# Patient Record
Sex: Male | Born: 1955 | Race: White | Hispanic: No | Marital: Married | Smoking: Never smoker
Health system: Southern US, Community
[De-identification: ages and names within clinical notes are randomized; demographics above are authoritative.]

## PROBLEM LIST (undated history)

## (undated) DIAGNOSIS — H269 Unspecified cataract: Secondary | ICD-10-CM

## (undated) DIAGNOSIS — H409 Unspecified glaucoma: Secondary | ICD-10-CM

## (undated) HISTORY — DX: Unspecified glaucoma: H40.9

## (undated) HISTORY — DX: Unspecified cataract: H26.9

---
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
## 2016-04-04 DIAGNOSIS — Z0100 Encounter for examination of eyes and vision without abnormal findings: Secondary | ICD-10-CM | POA: Diagnosis not present

## 2016-04-20 DIAGNOSIS — J1089 Influenza due to other identified influenza virus with other manifestations: Secondary | ICD-10-CM | POA: Diagnosis not present

## 2016-05-07 DIAGNOSIS — H21233 Degeneration of iris (pigmentary), bilateral: Secondary | ICD-10-CM | POA: Diagnosis not present

## 2016-05-07 DIAGNOSIS — H40023 Open angle with borderline findings, high risk, bilateral: Secondary | ICD-10-CM | POA: Diagnosis not present

## 2016-05-07 DIAGNOSIS — H2513 Age-related nuclear cataract, bilateral: Secondary | ICD-10-CM | POA: Diagnosis not present

## 2016-05-07 DIAGNOSIS — H25013 Cortical age-related cataract, bilateral: Secondary | ICD-10-CM | POA: Diagnosis not present

## 2016-05-07 DIAGNOSIS — H2511 Age-related nuclear cataract, right eye: Secondary | ICD-10-CM | POA: Diagnosis not present

## 2016-05-07 DIAGNOSIS — H40022 Open angle with borderline findings, high risk, left eye: Secondary | ICD-10-CM | POA: Diagnosis not present

## 2016-05-07 DIAGNOSIS — H524 Presbyopia: Secondary | ICD-10-CM | POA: Diagnosis not present

## 2016-05-07 DIAGNOSIS — H40021 Open angle with borderline findings, high risk, right eye: Secondary | ICD-10-CM | POA: Diagnosis not present

## 2016-05-08 ENCOUNTER — Emergency Department: Payer: BLUE CROSS/BLUE SHIELD

## 2016-05-08 ENCOUNTER — Encounter: Payer: Self-pay | Admitting: Primary Care

## 2016-05-08 ENCOUNTER — Emergency Department
Admission: EM | Admit: 2016-05-08 | Discharge: 2016-05-08 | Disposition: A | Discharge status: Home / Self Care | Payer: BLUE CROSS/BLUE SHIELD | Attending: Emergency Medicine | Admitting: Emergency Medicine

## 2016-05-08 ENCOUNTER — Encounter: Payer: Self-pay | Admitting: Emergency Medicine

## 2016-05-08 ENCOUNTER — Ambulatory Visit (INDEPENDENT_AMBULATORY_CARE_PROVIDER_SITE_OTHER): Payer: BLUE CROSS/BLUE SHIELD | Admitting: Primary Care

## 2016-05-08 VITALS — BP 112/78 | HR 77 | Temp 98.1°F | Ht 70.0 in | Wt 183.1 lb

## 2016-05-08 DIAGNOSIS — J111 Influenza due to unidentified influenza virus with other respiratory manifestations: Secondary | ICD-10-CM

## 2016-05-08 DIAGNOSIS — Z0000 Encounter for general adult medical examination without abnormal findings: Secondary | ICD-10-CM | POA: Diagnosis not present

## 2016-05-08 DIAGNOSIS — Z23 Encounter for immunization: Secondary | ICD-10-CM

## 2016-05-08 DIAGNOSIS — Z1159 Encounter for screening for other viral diseases: Secondary | ICD-10-CM

## 2016-05-08 DIAGNOSIS — R05 Cough: Secondary | ICD-10-CM | POA: Diagnosis not present

## 2016-05-08 DIAGNOSIS — R0602 Shortness of breath: Secondary | ICD-10-CM | POA: Insufficient documentation

## 2016-05-08 DIAGNOSIS — R69 Illness, unspecified: Secondary | ICD-10-CM

## 2016-05-08 DIAGNOSIS — Z87891 Personal history of nicotine dependence: Secondary | ICD-10-CM | POA: Diagnosis not present

## 2016-05-08 DIAGNOSIS — R509 Fever, unspecified: Secondary | ICD-10-CM | POA: Insufficient documentation

## 2016-05-08 DIAGNOSIS — R079 Chest pain, unspecified: Secondary | ICD-10-CM | POA: Diagnosis not present

## 2016-05-08 DIAGNOSIS — Z1211 Encounter for screening for malignant neoplasm of colon: Secondary | ICD-10-CM

## 2016-05-08 DIAGNOSIS — H409 Unspecified glaucoma: Secondary | ICD-10-CM | POA: Insufficient documentation

## 2016-05-08 LAB — BASIC METABOLIC PANEL
ANION GAP: 10 (ref 5–15)
BUN: 19 mg/dL (ref 6–20)
CALCIUM: 8.3 mg/dL — AB (ref 8.9–10.3)
CO2: 21 mmol/L — ABNORMAL LOW (ref 22–32)
Chloride: 104 mmol/L (ref 101–111)
Creatinine, Ser: 1.05 mg/dL (ref 0.61–1.24)
Glucose, Bld: 105 mg/dL — ABNORMAL HIGH (ref 65–99)
Potassium: 3.3 mmol/L — ABNORMAL LOW (ref 3.5–5.1)
SODIUM: 135 mmol/L (ref 135–145)

## 2016-05-08 LAB — CBC
HCT: 37.4 % — ABNORMAL LOW (ref 40.0–52.0)
HEMOGLOBIN: 13 g/dL (ref 13.0–18.0)
MCH: 29.5 pg (ref 26.0–34.0)
MCHC: 34.8 g/dL (ref 32.0–36.0)
MCV: 84.7 fL (ref 80.0–100.0)
PLATELETS: 234 10*3/uL (ref 150–440)
RBC: 4.42 MIL/uL (ref 4.40–5.90)
RDW: 13 % (ref 11.5–14.5)
WBC: 10.9 10*3/uL — AB (ref 3.8–10.6)

## 2016-05-08 LAB — INFLUENZA PANEL BY PCR (TYPE A & B)
INFLAPCR: NEGATIVE
Influenza B By PCR: NEGATIVE

## 2016-05-08 LAB — TROPONIN I

## 2016-05-08 MED ORDER — IBUPROFEN 600 MG PO TABS
600.0000 mg | ORAL_TABLET | Freq: Once | ORAL | Status: AC
  Administered 2016-05-08: 600 mg via ORAL
  Filled 2016-05-08: qty 1

## 2016-05-08 MED ORDER — OSELTAMIVIR PHOSPHATE 75 MG PO CAPS: 75.0000 mg | ORAL_CAPSULE | Freq: Two times a day (BID) | ORAL | 0 refills | Status: AC

## 2016-05-08 MED ORDER — ACETAMINOPHEN 325 MG PO TABS
650.0000 mg | ORAL_TABLET | Freq: Once | ORAL | Status: AC | PRN
  Administered 2016-05-08: 650 mg via ORAL
  Filled 2016-05-08: qty 2

## 2016-05-08 NOTE — ED Triage Notes (Addendum)
Pt presents to ED with c/o fever, chills and generalized body aches starting this afternoon. Pt reports had physical today and was given a tetanus shot. Pt reports tested positive for flu on 1/5. Pt reports "I was breathing really heavy and that caused my chest to hurt a little." Feeling short of breath this evening. Mask applied to patient.

## 2016-05-08 NOTE — Assessment & Plan Note (Signed)
Td due, provided today. Declines influenza vaccination and shingles vaccination. Colonoscopy due, ordered today. PSA due, pending. Discussed to increase water, vegetable, fruit, whole grain consumption. Exam unremarkable. Labs pending. Follow up in 1 year for annual physical.

## 2016-05-08 NOTE — ED Provider Notes (Signed)
[facility] Emergency Department Provider Note  ____________________________________________   None    (approximate)  I have reviewed the triage vital signs and the nursing notes.   HISTORY  Chief Complaint Fever and Shortness of Breath   HPI Thomas Curry is a 61 y.o. male with a history of glaucoma was presenting to the emergency department with several hours of bodyaches as well as chest heaviness over his sternum. He says that his symptoms started about a half an hour after receiving a tetanus shot. He has taken Tylenol as Afrin. He denies any pain to his throat or runny nose. He says that he had flu that was diagnosed on January 5 and completed a course of Tamiflu. He says that the symptoms feel identical to when he had the flu several weeks ago. Has the nausea vomiting or diarrhea.Also reports myalgias.   Past Medical History:  Diagnosis Date  . Cataracts, bilateral   . Glaucoma     Patient Active Problem List   Diagnosis Date Noted  . Glaucoma 05/08/2016  . Preventative health care 05/08/2016    Past Surgical History:  Procedure Laterality Date  . APPENDECTOMY  1961    Prior to Admission medications   Medication Sig Start Date End Date Taking? Authorizing Provider  ALPHAGAN P 0.1 % SOLN Place 1 drop into both eyes every 8 (eight) hours.  05/07/16   Historical Provider, MD    Allergies Patient has no known allergies.  Family History  Problem Relation Age of Onset  . Heart disease Father     Social History Social History  Substance Use Topics  . Smoking status: Never Smoker  . Smokeless tobacco: Former NeurosurgeonUser    Types: Chew  . Alcohol use No    Review of Systems Constitutional: fever Eyes: No visual changes. ENT: No sore throat. Cardiovascular: Denies chest pain. Respiratory: Denies shortness of breath. Gastrointestinal: No abdominal pain.  No nausea, no vomiting.  No diarrhea.  No constipation. Genitourinary: Negative for  dysuria. Musculoskeletal: Negative for back pain. Skin: Negative for rash. Neurological: Negative for headaches, focal weakness or numbness.  10-point ROS otherwise negative.  ____________________________________________   PHYSICAL EXAM:  VITAL SIGNS: ED Triage Vitals  Enc Vitals Group     BP 05/08/16 2046 121/73     Pulse Rate 05/08/16 2046 (!) 106     Resp 05/08/16 2046 18     Temp 05/08/16 2046 (!) 101 F (38.3 C)     Temp Source 05/08/16 2046 Oral     SpO2 05/08/16 2046 99 %     Weight 05/08/16 2047 182 lb (82.6 kg)     Height 05/08/16 2047 5\' 10"  (1.778 m)     Head Circumference --      Peak Flow --      Pain Score 05/08/16 2051 2     Pain Loc --      Pain Edu? --      Excl. in GC? --     Constitutional: Alert and oriented. Well appearing and in no acute distress. Eyes: Conjunctivae are normal. PERRL. EOMI. Head: Atraumatic. Nose: No congestion/rhinnorhea. Mouth/Throat: Mucous membranes are moist.   Neck: No stridor.   Cardiovascular: Normal rate, regular rhythm. Grossly normal heart sounds.   Respiratory: Normal respiratory effort.  No retractions. Lungs CTAB. Gastrointestinal: Soft and nontender. No distention.  Musculoskeletal: No lower extremity tenderness nor edema.  No joint effusions. Neurologic:  Normal speech and language. No gross focal neurologic deficits are appreciated.  Skin:  Skin is warm, dry and intact. No rash noted. Psychiatric: Mood and affect are normal. Speech and behavior are normal.  Bedside ultrasound without any pericardial effusion. ____________________________________________   LABS (all labs ordered are listed, but only abnormal results are displayed)  Labs Reviewed  BASIC METABOLIC PANEL - Abnormal; Notable for the following:       Result Value   Potassium 3.3 (*)    CO2 21 (*)    Glucose, Bld 105 (*)    Calcium 8.3 (*)    All other components within normal limits  CBC - Abnormal; Notable for the following:    WBC 10.9  (*)    HCT 37.4 (*)    All other components within normal limits  TROPONIN I  INFLUENZA PANEL BY PCR (TYPE A & B)   ____________________________________________  EKG  ED ECG REPORT I, Arelia Longest, the attending physician, personally viewed and interpreted this ECG.   Date: 05/08/2016  EKG Time: 2059  Rate: 84  Rhythm: normal sinus rhythm  Axis: Normal  Intervals:none  ST&T Change: No ST segment elevation or depression. No abnormal T-wave inversion.  ____________________________________________  RADIOLOGY  DG Chest 2 View (Final result)  Result time 05/08/16 21:35:33  Final result by Kennith Center, MD (05/08/16 21:35:33)           Narrative:   CLINICAL DATA: Mid chest pain and shortness of breath with cough and fever.  EXAM: CHEST 2 VIEW  COMPARISON: None.  FINDINGS: The lungs are clear wiithout focal pneumonia, edema, pneumothorax or pleural effusion. Lungs are hyperexpanded. Interstitial markings are diffusely coarsened with chronic features. The cardiopericardial silhouette is within normal limits for size. The visualized bony structures of the thorax are intact.  IMPRESSION: No acute cardiopulmonary findings.   Electronically Signed By: Kennith Center M.D. On: 05/08/2016 21:35            ____________________________________________   PROCEDURES  Procedure(s) performed:   Procedures  Critical Care performed:   ____________________________________________   INITIAL IMPRESSION / ASSESSMENT AND PLAN / ED COURSE  Pertinent labs & imaging results that were available during my care of the patient were reviewed by me and considered in my medical decision making (see chart for details).  Possible flulike illness versus reaction to tetanus shot. Patient said that he took Tylenol just prior to arrival but still has a persistent fever. Will be given ibuprofen and reassessed. I also discussed giving a wait-and-see prescription for  Tamiflu. We discussed that the symptoms did not go away by tomorrow that the patient could begin taking the Tamiflu tomorrow afternoon. It is possible, and he is concerned, that his symptoms may be caused by receiving a tetanus shot. He says that his sister has had a similar reaction to the tetanus shot in the past. He is denying any shortness breath at the time of the exam.    ----------------------------------------- 11:09 PM on 05/08/2016 -----------------------------------------  Patient has defervesced. Says that he is feeling greatly improved and without any body aches or pain or shortness of breath at this time. Will be discharged home. He understands to use the wait-and-see prescription for Tamiflu.  ____________________________________________   FINAL CLINICAL IMPRESSION(S) / ED DIAGNOSES  Fever. Shortness of breath.    NEW MEDICATIONS STARTED DURING THIS VISIT:  New Prescriptions   No medications on file     Note:  This document was prepared using Dragon voice recognition software and may include unintentional dictation errors.    Myra Rude  Meric Joye, MD 05/08/16 [id number]

## 2016-05-08 NOTE — Addendum Note (Signed)
Addended by: Tawnya CrookSAMBATH, Kareena Arrambide on: 05/08/2016 03:21 PM   Modules accepted: Orders

## 2016-05-08 NOTE — Patient Instructions (Signed)
Schedule a lab only appointment to return for labs. Ensure you're fasting 4 hours prior to your appointment. You may have water and black coffee only.  You will be contacted regarding your referral to GI for the colonoscopy.  Please let us know if you have not heard back within one week.   You were provided with a tetanus vaccination which will cover you for 10 years.  It's important to improve your diet by reducing consumption of fast food, fried food, processed snack foods, sugary drinks. Increase consumption of fresh vegetables and fruits, whole grains, water.  Ensure you are drinking 64 ounces of water daily.  Start exercising. You should be getting 150 minutes of moderate intensity exercise weekly.  Follow up in 1 year for repeat physical or sooner if needed.  It was a pleasure to meet you today! Please don't hesitate to call me with any questions. Welcome to [facility]!

## 2016-05-08 NOTE — Progress Notes (Signed)
Subjective:    Patient ID: Thomas Curry, male    DOB: 03-20-1956, 61 y.o.   MRN: [id number]  HPI  Thomas Curry is a 61 year old male who presents today to establish care and discuss the problems mentioned below. Will obtain old records. His last physical was years ago.   who presents today for complete physical.  Immunizations: -Tetanus: Completed over 10 years ago. -Influenza: Declines -Shingles: Declines  Diet: He endorses a healthy diet Breakfast: Cereal, diner food Lunch: Restaurants Dinner: Pasta, meat, vegetable, starch Snacks: Occasionally Desserts: Occasionally Beverages: Soda, little water, some sweet tea  Exercise: He does not currently exercise. Eye exam: Completed recently Dental exam: Completes semi-annually Colonoscopy: Never completed, due. PSA: Not completed recently. Hep C Screen: Due.     Review of Systems  Constitutional: Negative for unexpected weight change.  HENT: Negative for rhinorrhea.   Respiratory: Negative for cough and shortness of breath.   Cardiovascular: Negative for chest pain.  Gastrointestinal: Negative for constipation and diarrhea.  Genitourinary: Negative for difficulty urinating.  Musculoskeletal: Negative for arthralgias and myalgias.  Skin: Negative for rash.  Allergic/Immunologic: Negative for environmental allergies.  Neurological: Negative for dizziness, numbness and headaches.  Psychiatric/Behavioral:       Denies concerns for anxiety or depression       Past Medical History:  Diagnosis Date  . Cataracts, bilateral   . Glaucoma      Social History   Social History  . Marital status: Married    Spouse name: N/A  . Number of children: N/A  . Years of education: N/A   Occupational History  . Not on file.   Social History Main Topics  . Smoking status: Never Smoker  . Smokeless tobacco: Never Used  . Alcohol use Not on file  . Drug use: Unknown  . Sexual activity: Not on file   Other Topics Concern  .  Not on file   Social History Narrative  . No narrative on file    Past Surgical History:  Procedure Laterality Date  . APPENDECTOMY  1961    Family History  Problem Relation Age of Onset  . Heart disease Father     No Known Allergies  No current outpatient prescriptions on file prior to visit.   No current facility-administered medications on file prior to visit.     BP 112/78   Pulse 77   Temp 98.1 F (36.7 C) (Oral)   Ht 5\' 10"  (1.778 m)   Wt 183 lb 1.9 oz (83.1 kg)   SpO2 98%   BMI 26.27 kg/m    Objective:   Physical Exam  Constitutional: He is oriented to person, place, and time. He appears well-nourished.  HENT:  Right Ear: Tympanic membrane and ear canal normal.  Left Ear: Tympanic membrane and ear canal normal.  Nose: Nose normal. Right sinus exhibits no maxillary sinus tenderness and no frontal sinus tenderness. Left sinus exhibits no maxillary sinus tenderness and no frontal sinus tenderness.  Mouth/Throat: Oropharynx is clear and moist.  Eyes: Conjunctivae and EOM are normal. Pupils are equal, round, and reactive to light.  Neck: Neck supple. Carotid bruit is not present. No thyromegaly present.  Cardiovascular: Normal rate, regular rhythm and normal heart sounds.   Pulmonary/Chest: Effort normal and breath sounds normal. He has no wheezes. He has no rales.  Abdominal: Soft. Bowel sounds are normal. There is no tenderness.  Musculoskeletal: Normal range of motion.  Neurological: He is alert and oriented to person, place,  and time. He has normal reflexes. No cranial nerve deficit.  Skin: Skin is warm and dry.  Psychiatric: He has a normal mood and affect.          Assessment & Plan:

## 2016-05-08 NOTE — Assessment & Plan Note (Signed)
Diagnosed recently, managed on Alphagan gtts, following with opthomology. Also with cataracts, due for surgery in February.

## 2016-05-08 NOTE — Progress Notes (Signed)
Pre visit review using our clinic review tool, if applicable. No additional management support is needed unless otherwise documented below in the visit note. 

## 2016-05-08 NOTE — ED Notes (Signed)
Resumed care from nellie rn.  Pt alert.  States feeling better   nsr on monitor.  Family with pt.

## 2016-05-14 ENCOUNTER — Other Ambulatory Visit: Payer: BLUE CROSS/BLUE SHIELD

## 2016-05-15 ENCOUNTER — Ambulatory Visit (INDEPENDENT_AMBULATORY_CARE_PROVIDER_SITE_OTHER): Payer: BLUE CROSS/BLUE SHIELD | Admitting: Primary Care

## 2016-05-15 ENCOUNTER — Encounter: Payer: Self-pay | Admitting: Primary Care

## 2016-05-15 ENCOUNTER — Ambulatory Visit: Payer: BLUE CROSS/BLUE SHIELD | Admitting: Primary Care

## 2016-05-15 VITALS — BP 130/82 | HR 84 | Temp 98.0°F | Ht 70.0 in | Wt 182.8 lb

## 2016-05-15 DIAGNOSIS — Z0000 Encounter for general adult medical examination without abnormal findings: Secondary | ICD-10-CM

## 2016-05-15 DIAGNOSIS — T50A95A Adverse effect of other bacterial vaccines, initial encounter: Secondary | ICD-10-CM

## 2016-05-15 DIAGNOSIS — Z1159 Encounter for screening for other viral diseases: Secondary | ICD-10-CM | POA: Diagnosis not present

## 2016-05-15 DIAGNOSIS — H40053 Ocular hypertension, bilateral: Secondary | ICD-10-CM | POA: Diagnosis not present

## 2016-05-15 DIAGNOSIS — H21233 Degeneration of iris (pigmentary), bilateral: Secondary | ICD-10-CM | POA: Diagnosis not present

## 2016-05-15 LAB — LIPID PANEL
CHOLESTEROL: 179 mg/dL (ref 0–200)
HDL: 30.7 mg/dL — ABNORMAL LOW (ref >39.00)
LDL CALC: 115 mg/dL — AB (ref 0–99)
NONHDL: 148.26
Total CHOL/HDL Ratio: 6
Triglycerides: 168 mg/dL — ABNORMAL HIGH (ref 0.0–149.0)
VLDL: 33.6 mg/dL (ref 0.0–40.0)

## 2016-05-15 LAB — COMPREHENSIVE METABOLIC PANEL
ALBUMIN: 3.9 g/dL (ref 3.5–5.2)
ALK PHOS: 106 U/L (ref 39–117)
ALT: 15 U/L (ref 0–53)
AST: 13 U/L (ref 0–37)
BUN: 14 mg/dL (ref 6–23)
CHLORIDE: 103 meq/L (ref 96–112)
CO2: 33 mEq/L — ABNORMAL HIGH (ref 19–32)
CREATININE: 0.86 mg/dL (ref 0.40–1.50)
Calcium: 8.8 mg/dL (ref 8.4–10.5)
GFR: 96.03 mL/min (ref >60.00)
GLUCOSE: 93 mg/dL (ref 70–99)
Potassium: 4.2 mEq/L (ref 3.5–5.1)
SODIUM: 139 meq/L (ref 135–145)
TOTAL PROTEIN: 7 g/dL (ref 6.0–8.3)
Total Bilirubin: 0.4 mg/dL (ref 0.2–1.2)

## 2016-05-15 LAB — PSA: PSA: 2.06 ng/mL (ref 0.10–4.00)

## 2016-05-15 NOTE — Progress Notes (Signed)
Subjective:    Patient ID: Thomas Curry, male    DOB: 31-Jul-1955, 61 y.o.   MRN: [id number]  HPI  Thomas Curry is a 61 year old male who presents today for Emergency Department follow up.  He presented to [facility] ED on 05/08/16 with a chief complaint of body aches and chest heaviness that began 30 minutes after receiving a tetanus vaccination in our office. He was diagnosed and treated for influenza on 04/20/16 and completed a Tamiflu course. He did not disclose this information during that visit.  During his stay he underwent lab work including BMP (stable), CBC (unremarkable), Troponin 1 (negative), and influenza testing (negative). His symptoms improved over the next several hours after treatment with ibuprofen. He did experience fevers of 102 during his stay. He was discharged home later that evening.  Since discharge from the emergency department he's feeling well. He denies fevers, cough, sore throat, body aches, pain to injection site. He has no complaints today.  Review of Systems  Constitutional: Negative for fatigue and fever.  HENT: Negative for congestion and sore throat.   Respiratory: Negative for cough, shortness of breath and wheezing.   Cardiovascular: Negative for chest pain.       Past Medical History:  Diagnosis Date  . Cataracts, bilateral   . Glaucoma      Social History   Social History  . Marital status: Married    Spouse name: N/A  . Number of children: N/A  . Years of education: N/A   Occupational History  . Not on file.   Social History Main Topics  . Smoking status: Never Smoker  . Smokeless tobacco: Former Neurosurgeon    Types: Chew  . Alcohol use No  . Drug use: Unknown  . Sexual activity: Not on file   Other Topics Concern  . Not on file   Social History Narrative   Married.   Works as a Academic librarian.   Highest level of education: high school.        Past Surgical History:  Procedure Laterality Date  . APPENDECTOMY  1961    Family  History  Problem Relation Age of Onset  . Heart disease Father     No Known Allergies  Current Outpatient Prescriptions on File Prior to Visit  Medication Sig Dispense Refill  . ALPHAGAN P 0.1 % SOLN Place 1 drop into both eyes every 8 (eight) hours.   3   No current facility-administered medications on file prior to visit.     BP 130/82   Pulse 84   Temp 98 F (36.7 C) (Oral)   Ht 5\' 10"  (1.778 m)   Wt 182 lb 12.8 oz (82.9 kg)   SpO2 97%   BMI 26.23 kg/m    Objective:   Physical Exam  Constitutional: He appears well-nourished.  HENT:  Right Ear: Tympanic membrane and ear canal normal.  Left Ear: Tympanic membrane and ear canal normal.  Nose: No mucosal edema. Right sinus exhibits no maxillary sinus tenderness and no frontal sinus tenderness. Left sinus exhibits no maxillary sinus tenderness and no frontal sinus tenderness.  Mouth/Throat: Oropharynx is clear and moist.  Eyes: Conjunctivae are normal.  Neck: Neck supple.  Cardiovascular: Normal rate and regular rhythm.   Pulmonary/Chest: Effort normal and breath sounds normal. He has no wheezes. He has no rales.  Skin: Skin is warm and dry.          Assessment & Plan:  Emergency Department Follow Up:  Presented  to [facility] ED several hours after receiving Tetanus shot in our clinic. Experienced chest tightness with fevers within 30 minutes after receiving. Diagnosed with influenza and treated on 04/20/16 which was not disclosed during his visit on 05/08/16. Overall doing well. No complaints today. Exam unremarkable. Repeat potassium labs today given hypokalemia in ED. Fluids, rest, return precautions provided.  All ED notes and labs reviewed.  Morrie Sheldonlark,Virtie Bungert Kendal, NP

## 2016-05-15 NOTE — Patient Instructions (Addendum)
Complete lab work prior to leaving today from your physical. I will notify you of your results once received.   Ensure you are staying hydrated with water.  Please call me if your symptoms return.  It was a pleasure to see you today!

## 2016-05-15 NOTE — Progress Notes (Signed)
Pre visit review using our clinic review tool, if applicable. No additional management support is needed unless otherwise documented below in the visit note. 

## 2016-05-16 LAB — HEPATITIS C ANTIBODY: HCV Ab: NEGATIVE

## 2016-05-21 ENCOUNTER — Encounter: Payer: Self-pay | Admitting: *Deleted

## 2016-05-29 DIAGNOSIS — H25011 Cortical age-related cataract, right eye: Secondary | ICD-10-CM | POA: Diagnosis not present

## 2016-05-29 DIAGNOSIS — H25811 Combined forms of age-related cataract, right eye: Secondary | ICD-10-CM | POA: Diagnosis not present

## 2016-05-29 DIAGNOSIS — H2511 Age-related nuclear cataract, right eye: Secondary | ICD-10-CM | POA: Diagnosis not present

## 2016-09-20 ENCOUNTER — Encounter: Payer: Self-pay | Admitting: Gastroenterology

## 2016-09-26 DIAGNOSIS — H21233 Degeneration of iris (pigmentary), bilateral: Secondary | ICD-10-CM | POA: Diagnosis not present

## 2016-09-26 DIAGNOSIS — Z961 Presence of intraocular lens: Secondary | ICD-10-CM | POA: Diagnosis not present

## 2016-09-26 DIAGNOSIS — H40013 Open angle with borderline findings, low risk, bilateral: Secondary | ICD-10-CM | POA: Diagnosis not present

## 2016-11-02 ENCOUNTER — Ambulatory Visit (AMBULATORY_SURGERY_CENTER): Payer: Self-pay

## 2016-11-02 VITALS — Ht 71.0 in | Wt 190.2 lb

## 2016-11-02 DIAGNOSIS — Z1211 Encounter for screening for malignant neoplasm of colon: Secondary | ICD-10-CM

## 2016-11-02 MED ORDER — SUPREP BOWEL PREP KIT 17.5-3.13-1.6 GM/177ML PO SOLN
1.0000 | Freq: Once | ORAL | 0 refills | Status: AC
Start: 2016-11-02 — End: 2016-11-02

## 2016-11-02 NOTE — Progress Notes (Signed)
No allergies to eggs or soy No diet meds No home oxygen No past problems with anesthesia  Declined emmi 

## 2016-11-05 ENCOUNTER — Encounter: Payer: Self-pay | Admitting: Gastroenterology

## 2016-11-16 ENCOUNTER — Encounter: Payer: Self-pay | Admitting: Gastroenterology

## 2016-11-16 ENCOUNTER — Ambulatory Visit (AMBULATORY_SURGERY_CENTER): Payer: BLUE CROSS/BLUE SHIELD | Admitting: Gastroenterology

## 2016-11-16 VITALS — BP 106/56 | HR 57 | Temp 98.0°F | Resp 19 | Ht 71.0 in | Wt 190.0 lb

## 2016-11-16 DIAGNOSIS — Z1212 Encounter for screening for malignant neoplasm of rectum: Secondary | ICD-10-CM | POA: Diagnosis not present

## 2016-11-16 DIAGNOSIS — Z1211 Encounter for screening for malignant neoplasm of colon: Secondary | ICD-10-CM | POA: Diagnosis present

## 2016-11-16 MED ORDER — SODIUM CHLORIDE 0.9 % IV SOLN: 500.0000 mL | INTRAVENOUS | Status: AC

## 2016-11-16 NOTE — Op Note (Signed)
[name] Patient Name: Thomas Curry Procedure Date: 11/16/2016 1:18 PM MRN: [id number] Endoscopist: Meryl Dare , MD Age: 61 Referring MD:  Date of Birth: [birth year] Gender: Male Account #: [id number] Procedure:                Colonoscopy Indications:              Screening for colorectal malignant neoplasm Medicines:                Monitored Anesthesia Care Procedure:                Pre-Anesthesia Assessment:                           - Prior to the procedure, a History and Physical                            was performed, and patient medications and                            allergies were reviewed. The patient's tolerance of                            previous anesthesia was also reviewed. The risks                            and benefits of the procedure and the sedation                            options and risks were discussed with the patient.                            All questions were answered, and informed consent                            was obtained. Prior Anticoagulants: The patient has                            taken no previous anticoagulant or antiplatelet                            agents. ASA Grade Assessment: II - A patient with                            mild systemic disease. After reviewing the risks                            and benefits, the patient was deemed in                            satisfactory condition to undergo the procedure.                           After obtaining informed consent, the colonoscope  was passed under direct vision. Throughout the                            procedure, the patient's blood pressure, pulse, and                            oxygen saturations were monitored continuously. The                            Colonoscope was introduced through the anus and                            advanced to the the cecum, identified by                            appendiceal orifice and  ileocecal valve. The                            ileocecal valve, appendiceal orifice, and rectum                            were photographed. The quality of the bowel                            preparation was excellent. The colonoscopy was                            performed without difficulty. The patient tolerated                            the procedure well. Scope In: 1:23:05 PM Scope Out: 1:34:41 PM Scope Withdrawal Time: 0 hours 9 minutes 29 seconds  Total Procedure Duration: 0 hours 11 minutes 36 seconds  Findings:                 The perianal and digital rectal examinations were                            normal.                           Internal hemorrhoids were found during                            retroflexion. The hemorrhoids were small and Grade                            I (internal hemorrhoids that do not prolapse).                           The exam was otherwise without abnormality on                            direct and retroflexion views. Complications:            No immediate complications. Estimated blood loss:  None. Estimated Blood Loss:     Estimated blood loss: none. Impression:               -                           - Internal hemorrhoids.                           - The entire examined colon was otherwise normal on                            direct and retroflexion views.                           - No specimens collected. Recommendation:           - Repeat colonoscopy in 10 years for screening                            purposes.                           - Patient has a contact number available for                            emergencies. The signs and symptoms of potential                            delayed complications were discussed with the                            patient. Return to normal activities tomorrow.                            Written discharge instructions were provided to the                             patient.                           - Resume previous diet.                           - Continue present medications. Meryl DareMalcolm T Azuri Bozard, MD 11/16/2016 1:38:00 PM This report has been signed electronically.

## 2016-11-16 NOTE — Progress Notes (Signed)
Pt's states no medical or surgical changes since previsit or office visit. 

## 2016-11-16 NOTE — Progress Notes (Signed)
  [facility] Anesthesia Post-op Note  Patient: Thomas Curry  Procedure(s) Performed: colonoscopy  Patient Location: [facility] - Recovery Area  Anesthesia Type: Deep Sedation/Propofol  Level of Consciousness: awake, oriented and patient cooperative  Airway and Oxygen Therapy: Patient Spontanous Breathing  Post-op Pain: none  Post-op Assessment:  Post-op Vital signs reviewed, Patient's Cardiovascular Status Stable, Respiratory Function Stable, Patent Airway, No signs of Nausea or vomiting and Pain level controlled  Post-op Vital Signs: Reviewed and stable  Complications: No apparent anesthesia complications  Alanea Woolridge E Safiyah Cisney 1:40 PM

## 2016-11-16 NOTE — Patient Instructions (Signed)
YOU HAD AN ENDOSCOPIC PROCEDURE TODAY AT THE [facility]:   Refer to the procedure report that was given to you for any specific questions about what was found during the examination.  If the procedure report does not answer your questions, please call your gastroenterologist to clarify.  If you requested that your care partner not be given the details of your procedure findings, then the procedure report has been included in a sealed envelope for you to review at your convenience later.  YOU SHOULD EXPECT: Some feelings of bloating in the abdomen. Passage of more gas than usual.  Walking can help get rid of the air that was put into your GI tract during the procedure and reduce the bloating. If you had a lower endoscopy (such as a colonoscopy or flexible sigmoidoscopy) you may notice spotting of blood in your stool or on the toilet paper. If you underwent a bowel prep for your procedure, you may not have a normal bowel movement for a few days.  Please Note:  You might notice some irritation and congestion in your nose or some drainage.  This is from the oxygen used during your procedure.  There is no need for concern and it should clear up in a day or so.  SYMPTOMS TO REPORT IMMEDIATELY:   Following lower endoscopy (colonoscopy or flexible sigmoidoscopy):  Excessive amounts of blood in the stool  Significant tenderness or worsening of abdominal pains  Swelling of the abdomen that is new, acute  Fever of 100F or higher  For urgent or emergent issues, a gastroenterologist can be reached at any hour by calling [phone].   DIET:  We do recommend a small meal at first, but then you may proceed to your regular diet.  Drink plenty of fluids but you should avoid alcoholic beverages for 24 hours.  ACTIVITY:  You should plan to take it easy for the rest of today and you should NOT DRIVE or use heavy machinery until tomorrow (because of the sedation medicines used during the test).     FOLLOW UP: Our staff will call the number listed on your records the next business day following your procedure to check on you and address any questions or concerns that you may have regarding the information given to you following your procedure. If we do not reach you, we will leave a message.  However, if you are feeling well and you are not experiencing any problems, there is no need to return our call.  We will assume that you have returned to your regular daily activities without incident.  If any biopsies were taken you will be contacted by phone or by letter within the next 1-3 weeks.  Please call us at [phone] if you have not heard about the biopsies in 3 weeks.    SIGNATURES/CONFIDENTIALITY: You and/or your care partner have signed paperwork which will be entered into your electronic medical record.  These signatures attest to the fact that that the information above on your After Visit Summary has been reviewed and is understood.  Full responsibility of the confidentiality of this discharge information lies with you and/or your care-partner. 

## 2016-11-19 ENCOUNTER — Telehealth: Payer: Self-pay

## 2016-11-19 NOTE — Telephone Encounter (Signed)
Called [phone]  we tried to reach pt for a follow up call.  The pt's phone is not set up to take voice mail.  No message left.Lenice Llamas. [email or web address]

## 2016-11-20 ENCOUNTER — Telehealth: Payer: Self-pay | Admitting: *Deleted

## 2016-11-20 NOTE — Telephone Encounter (Signed)
  Follow up Call-  Call back number 11/16/2016  Post procedure Call Back phone  # [phone]  Permission to leave phone message No  comments phone not set up for messages     Patient questions:  Do you have a fever, pain , or abdominal swelling? No. Pain Score  0 *  Have you tolerated food without any problems? Yes.    Have you been able to return to your normal activities? Yes.    Do you have any questions about your discharge instructions: Diet   No. Medications  No. Follow up visit  No.  Do you have questions or concerns about your Care? No.  Actions: * If pain score is 4 or above: No action needed, pain <4.

## 2017-04-11 DIAGNOSIS — H40013 Open angle with borderline findings, low risk, bilateral: Secondary | ICD-10-CM | POA: Diagnosis not present

## 2017-04-11 DIAGNOSIS — Z961 Presence of intraocular lens: Secondary | ICD-10-CM | POA: Diagnosis not present

## 2017-04-11 DIAGNOSIS — H21233 Degeneration of iris (pigmentary), bilateral: Secondary | ICD-10-CM | POA: Diagnosis not present

## 2017-09-28 IMAGING — CR DG CHEST 2V
1 series · 2 of 2 positions shown · non-contrast
Comparison: None.

CLINICAL DATA: Mid chest pain and shortness of breath with cough
and fever.

EXAM:
CHEST  2 VIEW

[Series 1: dg chest 2 view · 0.14mm/px · 2 of 2 slices shown]
[im 1/2]
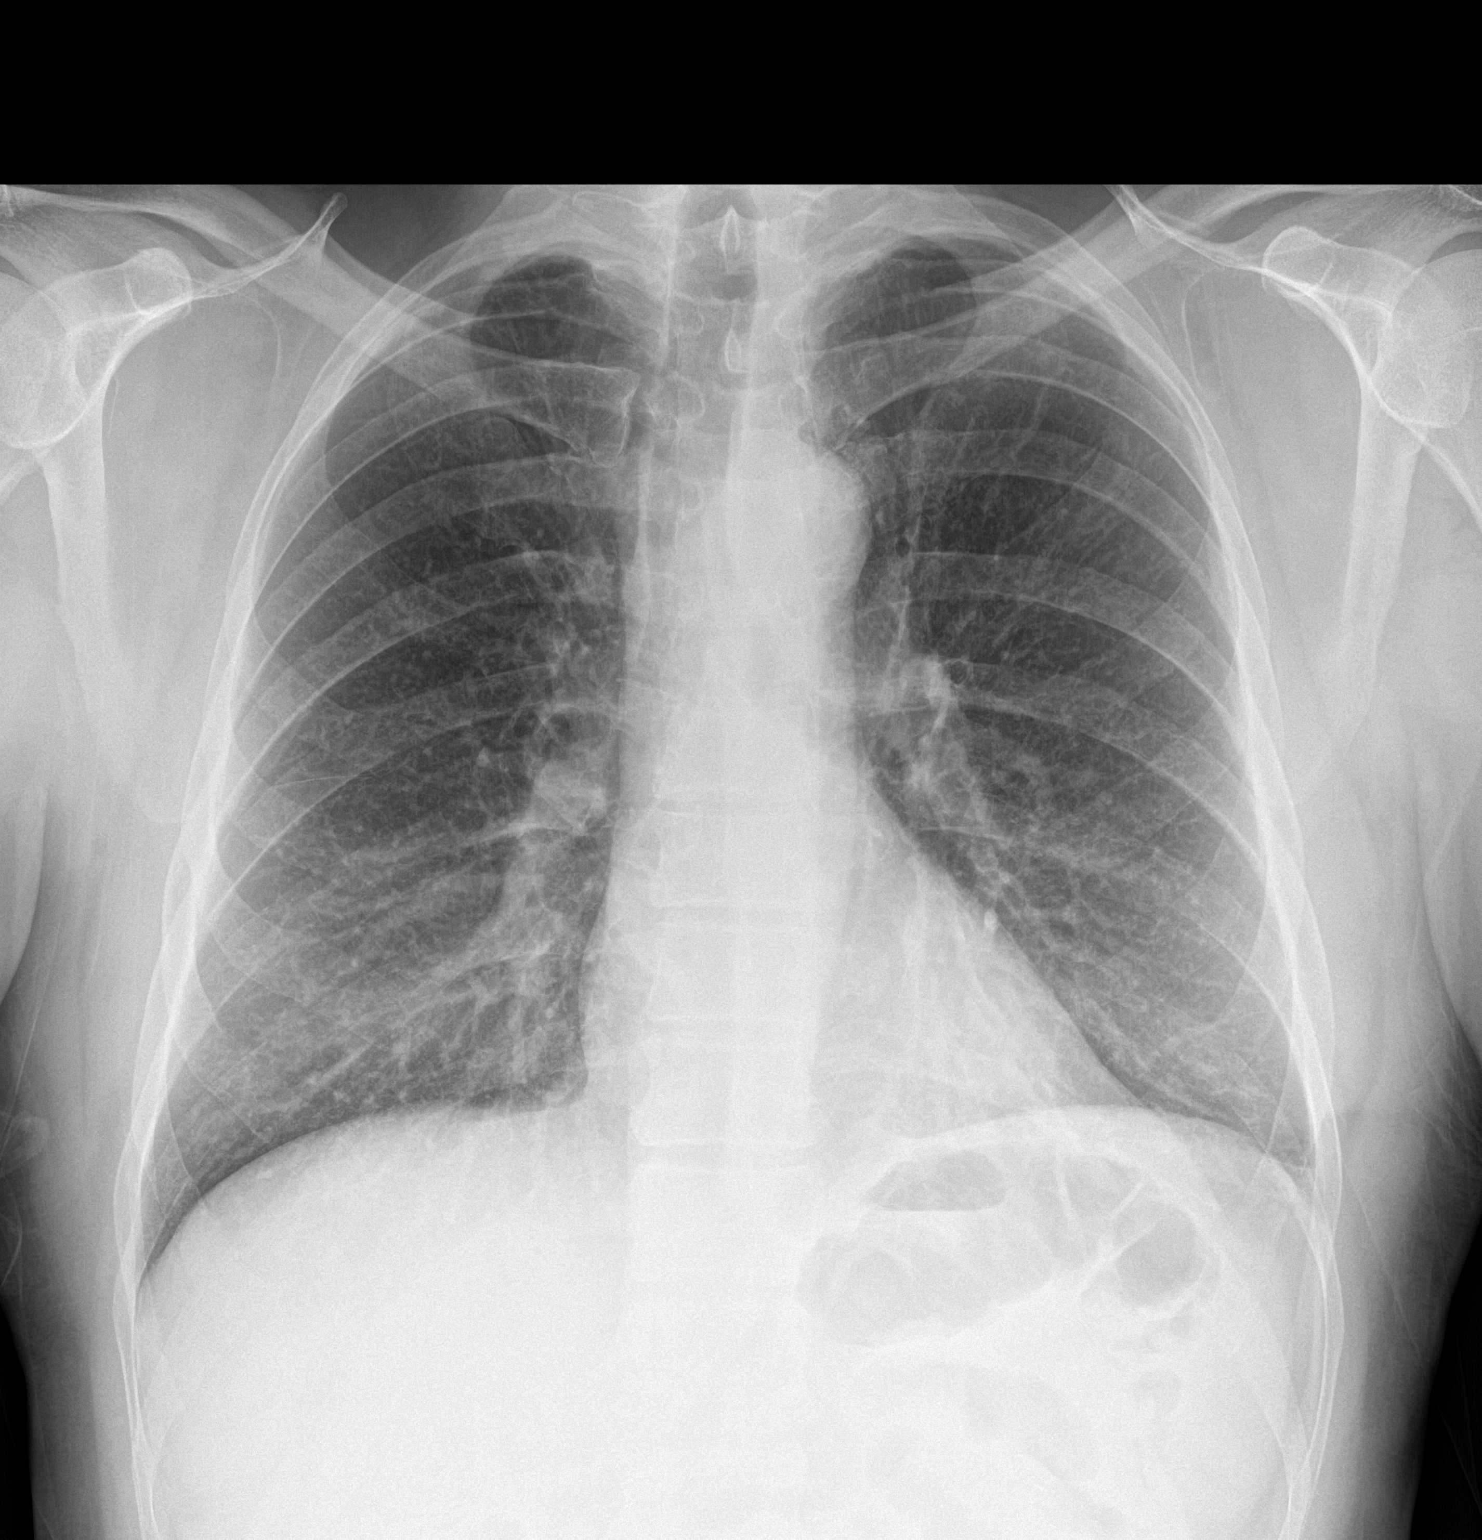
[im 2/2]
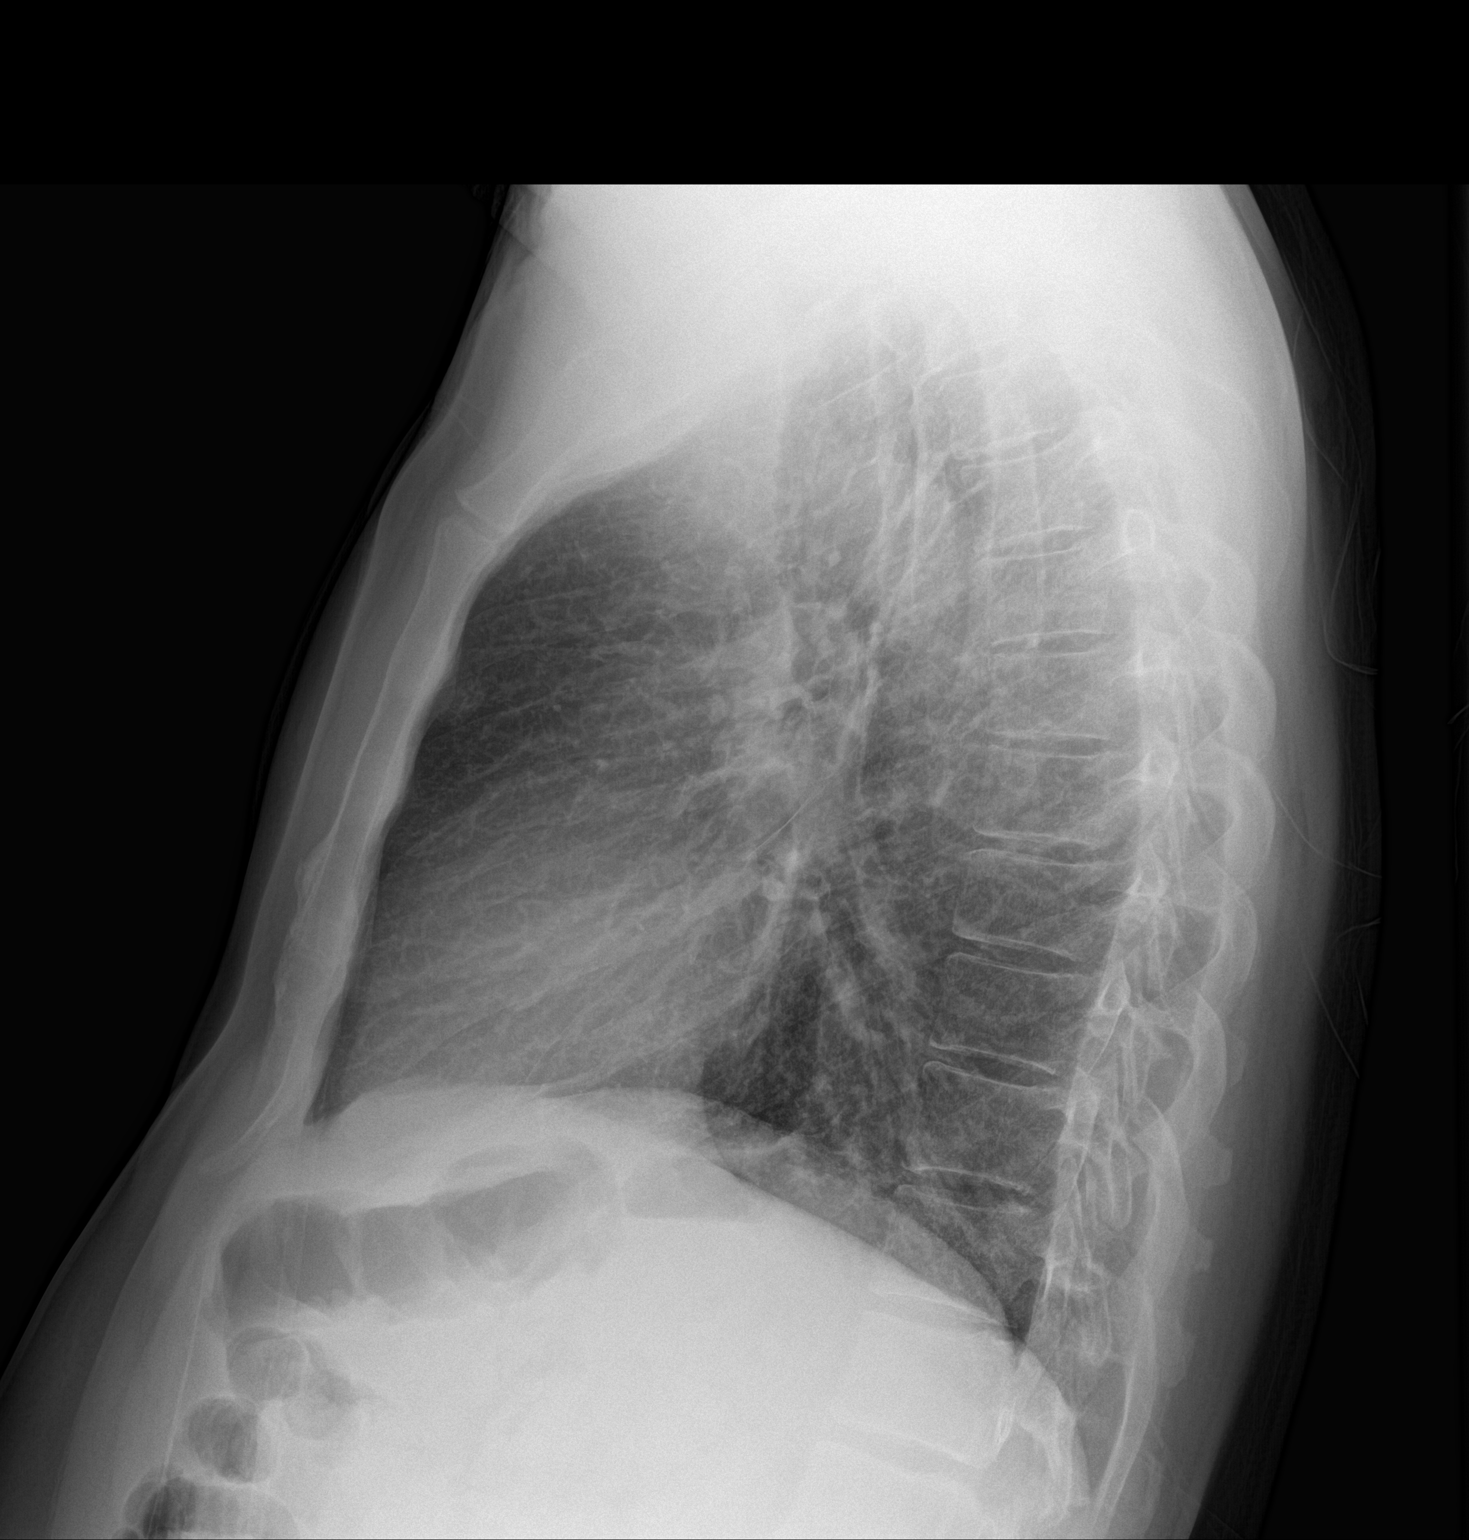

[2 of 2 positions shown; findings below may reference images not displayed]

FINDINGS: The lungs are clear wiithout focal pneumonia, edema, pneumothorax or
pleural effusion. Lungs are hyperexpanded. Interstitial markings are
diffusely coarsened with chronic features. The cardiopericardial
silhouette is within normal limits for size. The visualized bony
structures of the thorax are intact.
IMPRESSION: No acute cardiopulmonary findings.

## 2018-04-14 DIAGNOSIS — H524 Presbyopia: Secondary | ICD-10-CM | POA: Diagnosis not present

## 2018-12-02 DIAGNOSIS — H40013 Open angle with borderline findings, low risk, bilateral: Secondary | ICD-10-CM | POA: Diagnosis not present

## 2018-12-02 DIAGNOSIS — H10213 Acute toxic conjunctivitis, bilateral: Secondary | ICD-10-CM | POA: Diagnosis not present

## 2018-12-02 DIAGNOSIS — H21233 Degeneration of iris (pigmentary), bilateral: Secondary | ICD-10-CM | POA: Diagnosis not present

## 2019-02-02 DIAGNOSIS — H21233 Degeneration of iris (pigmentary), bilateral: Secondary | ICD-10-CM | POA: Diagnosis not present

## 2019-02-02 DIAGNOSIS — H40013 Open angle with borderline findings, low risk, bilateral: Secondary | ICD-10-CM | POA: Diagnosis not present

## 2020-04-25 DIAGNOSIS — H40023 Open angle with borderline findings, high risk, bilateral: Secondary | ICD-10-CM | POA: Diagnosis not present

## 2020-04-25 DIAGNOSIS — H2512 Age-related nuclear cataract, left eye: Secondary | ICD-10-CM | POA: Diagnosis not present

## 2020-04-25 DIAGNOSIS — H21233 Degeneration of iris (pigmentary), bilateral: Secondary | ICD-10-CM | POA: Diagnosis not present

## 2020-12-26 DIAGNOSIS — H2512 Age-related nuclear cataract, left eye: Secondary | ICD-10-CM | POA: Diagnosis not present

## 2020-12-26 DIAGNOSIS — H40023 Open angle with borderline findings, high risk, bilateral: Secondary | ICD-10-CM | POA: Diagnosis not present

## 2020-12-26 DIAGNOSIS — H21233 Degeneration of iris (pigmentary), bilateral: Secondary | ICD-10-CM | POA: Diagnosis not present

## 2021-06-01 ENCOUNTER — Ambulatory Visit
Admission: EM | Admit: 2021-06-01 | Discharge: 2021-06-01 | Disposition: A | Discharge status: Home / Self Care | Payer: Medicare Other | Attending: Emergency Medicine | Admitting: Emergency Medicine

## 2021-06-01 ENCOUNTER — Other Ambulatory Visit: Payer: Self-pay

## 2021-06-01 DIAGNOSIS — B349 Viral infection, unspecified: Secondary | ICD-10-CM

## 2021-06-01 LAB — POCT INFLUENZA A/B
Influenza A, POC: NEGATIVE
Influenza B, POC: NEGATIVE

## 2021-06-01 LAB — POCT RAPID STREP A (OFFICE): Rapid Strep A Screen: NEGATIVE

## 2021-06-01 NOTE — Discharge Instructions (Addendum)
Your strep and flu tests are negative.  Your COVID, RSV, and Flu tests are pending.  You should self quarantine until the test results are back.    Take Tylenol or ibuprofen as needed for fever or discomfort.  Rest and keep yourself hydrated.    Follow-up with your primary care provider if your symptoms are not improving.

## 2021-06-01 NOTE — ED Provider Notes (Signed)
Renaldo Fiddler    CSN: [id number] Arrival date & time: 06/01/21  1424      History   Chief Complaint Chief Complaint  Patient presents with   Cough    HPI Herb Beltre is a 66 y.o. male.  Accompanied by his wife, patient presents with 1 day history of chills, body aches, congestion, sore throat, cough, nausea. Unsure if he had a fever; temperature not taken.  No rash, chest pain, shortness of breath, vomiting, diarrhea, or other symptoms.  Treatment at home with Tylenol. His medical history includes glaucoma.    The history is provided by the patient, the spouse and medical records.   Past Medical History:  Diagnosis Date   Cataracts, bilateral    Glaucoma     Patient Active Problem List   Diagnosis Date Noted   Glaucoma 05/08/2016   Preventative health care 05/08/2016    Past Surgical History:  Procedure Laterality Date   APPENDECTOMY  1961       Home Medications    Prior to Admission medications   Medication Sig Start Date End Date Taking? Authorizing Provider  ALPHAGAN P 0.1 % SOLN Place 1 drop into both eyes 2 (two) times daily.  05/07/16   [provider]    Family History Family History  Problem Relation Age of Onset   Heart disease Father    Colon cancer Neg Hx    Esophageal cancer Neg Hx    Stomach cancer Neg Hx    Pancreatic cancer Neg Hx     Social History Social History   Tobacco Use   Smoking status: Never   Smokeless tobacco: Former    Types: Chew  Substance Use Topics   Alcohol use: No   Drug use: No     Allergies   Patient has no known allergies.   Review of Systems Review of Systems  Constitutional:  Positive for chills. Negative for fever.  HENT:  Positive for congestion and sore throat. Negative for ear pain.   Respiratory:  Positive for cough. Negative for shortness of breath.   Cardiovascular:  Negative for chest pain and palpitations.  Gastrointestinal:  Positive for nausea. Negative for abdominal  pain, diarrhea and vomiting.  Skin:  Negative for color change and rash.  All other systems reviewed and are negative.   Physical Exam Triage Vital Signs ED Triage Vitals  Enc Vitals Group     BP      Pulse      Resp      Temp      Temp src      SpO2      Weight      Height      Head Circumference      Peak Flow      Pain Score      Pain Loc      Pain Edu?      Excl. in GC?    No data found.  Updated Vital Signs BP 127/76    Pulse 88    Temp 98.6 F (37 C)    Resp 18    SpO2 93%   Visual Acuity Right Eye Distance:   Left Eye Distance:   Bilateral Distance:    Right Eye Near:   Left Eye Near:    Bilateral Near:     Physical Exam Vitals and nursing note reviewed.  Constitutional:      General: He is not in acute distress.    Appearance: Normal  appearance. He is well-developed.  HENT:     Right Ear: Tympanic membrane normal.     Left Ear: Tympanic membrane normal.     Nose: Congestion present.     Mouth/Throat:     Mouth: Mucous membranes are moist.     Pharynx: Posterior oropharyngeal erythema present.  Cardiovascular:     Rate and Rhythm: Normal rate and regular rhythm.     Heart sounds: Normal heart sounds.  Pulmonary:     Effort: Pulmonary effort is normal. No respiratory distress.     Breath sounds: Normal breath sounds.  Musculoskeletal:     Cervical back: Neck supple.  Skin:    General: Skin is warm and dry.  Neurological:     Mental Status: He is alert.  Psychiatric:        Mood and Affect: Mood normal.        Behavior: Behavior normal.     UC Treatments / Results  Labs (all labs ordered are listed, but only abnormal results are displayed) Labs Reviewed  COVID-19, FLU A+B AND RSV  POCT RAPID STREP A (OFFICE)  POCT INFLUENZA A/B    EKG   Radiology No results found.  Procedures Procedures (including critical care time)  Medications Ordered in UC Medications - No data to display  Initial Impression / Assessment and Plan / UC  Course  I have reviewed the triage vital signs and the nursing notes.  Pertinent labs & imaging results that were available during my care of the patient were reviewed by me and considered in my medical decision making (see chart for details).    Viral illness.  Rapid strep negative. Rapid flu negative.  Respiratory panel pending.  Instructed patient to self quarantine per CDC guidelines.  Discussed symptomatic treatment including Tylenol or ibuprofen, rest, hydration.  Instructed patient to follow up with PCP if symptoms are not improving.  Patient agrees to plan of care.   Final Clinical Impressions(s) / UC Diagnoses   Final diagnoses:  Viral illness     Discharge Instructions      Your strep and flu tests are negative.  Your COVID, RSV, and Flu tests are pending.  You should self quarantine until the test results are back.    Take Tylenol or ibuprofen as needed for fever or discomfort.  Rest and keep yourself hydrated.    Follow-up with your primary care provider if your symptoms are not improving.         ED Prescriptions   None    PDMP not reviewed this encounter.   Mickie Bail, NP 06/01/21 [phone]

## 2021-06-01 NOTE — ED Triage Notes (Signed)
Triaged by provider  

## 2021-06-03 LAB — COVID-19, FLU A+B AND RSV
Influenza A, NAA: DETECTED — AB
Influenza B, NAA: NOT DETECTED
RSV, NAA: NOT DETECTED
SARS-CoV-2, NAA: NOT DETECTED

## 2021-06-04 ENCOUNTER — Telehealth: Payer: Self-pay | Admitting: Family Medicine

## 2021-06-04 MED ORDER — OSELTAMIVIR PHOSPHATE 75 MG PO CAPS: 75.0000 mg | ORAL_CAPSULE | Freq: Two times a day (BID) | ORAL | 0 refills | Status: DC

## 2021-06-04 MED ORDER — OSELTAMIVIR PHOSPHATE 75 MG PO CAPS: 75.0000 mg | ORAL_CAPSULE | Freq: Two times a day (BID) | ORAL | 0 refills | Status: AC

## 2021-06-04 NOTE — Telephone Encounter (Signed)
Tamiflu sent to the incorrect pharmacy.  Resent to [facility] in [place] opposed to [place].

## 2021-06-04 NOTE — Telephone Encounter (Signed)
Flu positive. Tamiflu sent to pharmacy. Pt called to request.

## 2021-06-04 NOTE — Addendum Note (Signed)
Addended by: Scot Jun on: 06/04/2021 11:30 AM   Modules accepted: Orders

## 2021-10-27 DIAGNOSIS — H409 Unspecified glaucoma: Secondary | ICD-10-CM | POA: Diagnosis not present

## 2021-10-27 DIAGNOSIS — Z974 Presence of external hearing-aid: Secondary | ICD-10-CM | POA: Diagnosis not present

## 2021-12-01 DIAGNOSIS — Z1159 Encounter for screening for other viral diseases: Secondary | ICD-10-CM | POA: Diagnosis not present

## 2021-12-01 DIAGNOSIS — Z13220 Encounter for screening for lipoid disorders: Secondary | ICD-10-CM | POA: Diagnosis not present

## 2021-12-01 DIAGNOSIS — Z0000 Encounter for general adult medical examination without abnormal findings: Secondary | ICD-10-CM | POA: Diagnosis not present

## 2021-12-01 DIAGNOSIS — Z125 Encounter for screening for malignant neoplasm of prostate: Secondary | ICD-10-CM | POA: Diagnosis not present

## 2021-12-01 DIAGNOSIS — Z131 Encounter for screening for diabetes mellitus: Secondary | ICD-10-CM | POA: Diagnosis not present

## 2021-12-27 DIAGNOSIS — H21233 Degeneration of iris (pigmentary), bilateral: Secondary | ICD-10-CM | POA: Diagnosis not present

## 2021-12-27 DIAGNOSIS — H2512 Age-related nuclear cataract, left eye: Secondary | ICD-10-CM | POA: Diagnosis not present

## 2021-12-27 DIAGNOSIS — H40023 Open angle with borderline findings, high risk, bilateral: Secondary | ICD-10-CM | POA: Diagnosis not present

## 2022-02-17 DIAGNOSIS — Z23 Encounter for immunization: Secondary | ICD-10-CM | POA: Diagnosis not present

## 2022-07-11 DIAGNOSIS — E785 Hyperlipidemia, unspecified: Secondary | ICD-10-CM | POA: Diagnosis not present

## 2022-07-11 DIAGNOSIS — R0789 Other chest pain: Secondary | ICD-10-CM | POA: Diagnosis not present

## 2022-07-11 DIAGNOSIS — R0602 Shortness of breath: Secondary | ICD-10-CM | POA: Diagnosis not present

## 2022-08-14 DIAGNOSIS — E785 Hyperlipidemia, unspecified: Secondary | ICD-10-CM | POA: Diagnosis not present

## 2022-08-20 NOTE — Progress Notes (Unsigned)
Cardiology Office Note:    Date:  08/23/2022   ID:  Ulis Rias, DOB Apr 26, 1955, MRN [id number]  PCP:  Doreene Nest, NP  Cardiologist:  Little Ishikawa, MD  Electrophysiologist:  None   Referring MD: Irven Coe, MD   Chief Complaint  Patient presents with   Chest Pain    History of Present Illness:    Thomas Curry is a 67 y.o. male with a hx of hyperlipidemia, glaucoma who is referred by Dr. Lewie Chamber for evaluation of chest pain.  He reports that over the last 6 months he has noted that he tires easily.  He works in Scientist, research (physical sciences) and states that about a month ago he was digging a ditch and felt chest pain.  Describes a tightness in the center chest as he was working.  States that he stopped, and chest pain resolved after about 1 minute.  Denies any chest pain since that time but reports he has not been exerting himself that much.  Does report he gets short of breath, particular with walking up stairs.  Reports some lightheadedness but denies any syncope.  Denies any lower extremity edema or palpitations.  He chewed tobacco for 15 to 20 years, quit in his 30s.  Family history includes father had CABG at age 46, and paternal grandfather died of MI in 94s.  Labs on Aug 25, 2022 showed creatinine 1.05, hemoglobin 13.9, LDL 97.   Past Medical History:  Diagnosis Date   Cataracts, bilateral    Glaucoma     Past Surgical History:  Procedure Laterality Date   APPENDECTOMY  1961    Current Medications: Current Meds  Medication Sig   ALPHAGAN P 0.1 % SOLN Place 1 drop into both eyes 2 (two) times daily.    rosuvastatin (CRESTOR) 10 MG tablet Take 10 mg by mouth at bedtime.   Current Facility-Administered Medications for the 08/23/22 encounter (Office Visit) with Little Ishikawa, MD  Medication   0.9 %  sodium chloride infusion     Allergies:   Patient has no known allergies.   Social History   Socioeconomic History   Marital status: Married    Spouse name:  Not on file   Number of children: Not on file   Years of education: Not on file   Highest education level: Not on file  Occupational History   Not on file  Tobacco Use   Smoking status: Never   Smokeless tobacco: Former    Types: Chew  Substance and Sexual Activity   Alcohol use: No   Drug use: No   Sexual activity: Not on file  Other Topics Concern   Not on file  Social History Narrative   Married.   Works as a Academic librarian.   Highest level of education: high school.    Social Determinants of Health   Financial Resource Strain: Not on file  Food Insecurity: Not on file  Transportation Needs: Not on file  Physical Activity: Not on file  Stress: Not on file  Social Connections: Not on file     Family History: The patient's family history includes Heart disease in his father. There is no history of Colon cancer, Esophageal cancer, Stomach cancer, or Pancreatic cancer.  ROS:   Please see the history of present illness.     All other systems reviewed and are negative.  EKGs/Labs/Other Studies Reviewed:    The following studies were reviewed today:   EKG:   08/23/2022 sinus bradycardia, rate 51, no ST  abnormality  Recent Labs: No results found for requested labs within last 365 days.  Recent Lipid Panel    Component Value Date/Time   CHOL 179 05/15/2016 1518   TRIG 168.0 (H) 05/15/2016 1518   HDL 30.70 (L) 05/15/2016 1518   CHOLHDL 6 05/15/2016 1518   VLDL 33.6 05/15/2016 1518   LDLCALC 115 (H) 05/15/2016 1518    Physical Exam:    VS:  BP 116/72 (BP Location: Left Arm, Patient Position: Sitting, Cuff Size: Large)   Pulse (!) 51   Ht 5\' 11"  (1.803 m)   Wt 182 lb (82.6 kg)   BMI 25.38 kg/m     Wt Readings from Last 3 Encounters:  08/23/22 182 lb (82.6 kg)  11/16/16 190 lb (86.2 kg)  11/02/16 190 lb 3.2 oz (86.3 kg)     GEN:  Well nourished, well developed in no acute distress HEENT: Normal NECK: No JVD; No carotid bruits LYMPHATICS: No  lymphadenopathy CARDIAC: RRR, no murmurs, rubs, gallops RESPIRATORY:  Clear to auscultation without rales, wheezing or rhonchi  ABDOMEN: Soft, non-tender, non-distended MUSCULOSKELETAL:  No edema; No deformity  SKIN: Warm and dry NEUROLOGIC:  Alert and oriented x 3 PSYCHIATRIC:  Normal affect   ASSESSMENT:    1. Chest pain of uncertain etiology   2. Hyperlipidemia, unspecified hyperlipidemia type    PLAN:    Chest pain: Description suggests typical angina, as describes exertional chest pressure that resolves with rest.  Does have multiple CAD risk factors (age, hyperlipidemia, former tobacco use, family history). -Recommend coronary CTA for further evaluation.  Resting heart rate in 50s, will not give metoprolol prior to study -Echocardiogram  Hyperlipidemia: On rosuvastatin 10 mg daily.  LDL 97 on 08/14/2022.  Follow-up results of coronary CTA to guide how aggressive to be in lowering cholesterol  RTC in 3 months   Medication Adjustments/Labs and Tests Ordered: Current medicines are reviewed at length with the patient today.  Concerns regarding medicines are outlined above.  Orders Placed This Encounter  Procedures   CT CORONARY MORPH W/CTA COR W/SCORE W/CA W/CM &/OR WO/CM   EKG 12-Lead   ECHOCARDIOGRAM COMPLETE   No orders of the defined types were placed in this encounter.   Patient Instructions  Medication Instructions:  Your physician recommends that you continue on your current medications as directed. Please refer to the Current Medication list given to you today.  *If you need a refill on your cardiac medications before your next appointment, please call your pharmacy*  Testing/Procedures: Coronary CTA-see instructions below  Your physician has requested that you have an echocardiogram. Echocardiography is a painless test that uses sound waves to create images of your heart. It provides your doctor with information about the size and shape of your heart and how  well your heart's chambers and valves are working. This procedure takes approximately one hour. There are no restrictions for this procedure. Please do NOT wear cologne, perfume, aftershave, or lotions (deodorant is allowed). Please arrive 15 minutes prior to your appointment time.  Follow-Up: At [facility], you and your health needs are our priority.  As part of our continuing mission to provide you with exceptional heart care, we have created designated Provider Care Teams.  These Care Teams include your primary Cardiologist (physician) and Advanced Practice Providers (APPs -  Physician Assistants and Nurse Practitioners) who all work together to provide you with the care you need, when you need it.  We recommend signing up for the patient portal called "  MyChart".  Sign up information is provided on this After Visit Summary.  MyChart is used to connect with patients for Virtual Visits (Telemedicine).  Patients are able to view lab/test results, encounter notes, upcoming appointments, etc.  Non-urgent messages can be sent to your provider as well.   To learn more about what you can do with MyChart, go to [email or web address].    Your next appointment:   3 month(s)  Provider:   Little Ishikawa, MD     Other Instructions   Your cardiac CT will be scheduled at one of the below locations:   [facility] [address] [place], [place] [address] [phone]  If scheduled at [facility], please arrive at the [facility] and Children's Entrance (Entrance C2) of [facility] 30 minutes prior to test start time. You can use the FREE valet parking offered at entrance C (encouraged to control the heart rate for the test)  Proceed to the [facility] Radiology Department (first floor) to check-in and test prep.  All radiology patients and guests should use entrance C2 at [facility], accessed from [facility], even though the hospital's  physical address listed is [address].      Please follow these instructions carefully (unless otherwise directed):  Hold all erectile dysfunction medications at least 3 days (72 hrs) prior to test. (Ie viagra, cialis, sildenafil, tadalafil, etc) We will administer nitroglycerin during this exam.   On the Night Before the Test: Be sure to Drink plenty of water. Do not consume any caffeinated/decaffeinated beverages or chocolate 12 hours prior to your test. Do not take any antihistamines 12 hours prior to your test.  On the Day of the Test: Drink plenty of water until 1 hour prior to the test. Do not eat any food 1 hour prior to test. You may take your regular medications prior to the test.  If you take Furosemide/Hydrochlorothiazide/Spironolactone, please HOLD on the morning of the test.  After the Test: Drink plenty of water. After receiving IV contrast, you may experience a mild flushed feeling. This is normal. On occasion, you may experience a mild rash up to 24 hours after the test. This is not dangerous. If this occurs, you can take Benadryl 25 mg and increase your fluid intake. If you experience trouble breathing, this can be serious. If it is severe call 911 IMMEDIATELY. If it is mild, please call our office. If you take any of these medications: Glipizide/Metformin, Avandament, Glucavance, please do not take 48 hours after completing test unless otherwise instructed.  We will call to schedule your test 2-4 weeks out understanding that some insurance companies will need an authorization prior to the service being performed.   For non-scheduling related questions, please contact the cardiac imaging nurse navigator should you have any questions/concerns: Rockwell [place], Cardiac Imaging Nurse Navigator Larey Brick, Cardiac Imaging Nurse Navigator [facility] Direct Office Dial: [phone]   For scheduling needs, including  cancellations and rescheduling, please call [place], [phone].     Signed, Little Ishikawa, MD  08/23/2022 6:00 PM    [facility]

## 2022-08-23 ENCOUNTER — Encounter: Payer: Self-pay | Admitting: Cardiology

## 2022-08-23 ENCOUNTER — Ambulatory Visit: Payer: Medicare Other | Attending: Cardiology | Admitting: Cardiology

## 2022-08-23 VITALS — BP 116/72 | HR 51 | Ht 71.0 in | Wt 182.0 lb

## 2022-08-23 DIAGNOSIS — E785 Hyperlipidemia, unspecified: Secondary | ICD-10-CM

## 2022-08-23 DIAGNOSIS — R079 Chest pain, unspecified: Secondary | ICD-10-CM

## 2022-08-23 NOTE — Patient Instructions (Addendum)
Medication Instructions:  Your physician recommends that you continue on your current medications as directed. Please refer to the Current Medication list given to you today.  *If you need a refill on your cardiac medications before your next appointment, please call your pharmacy*  Testing/Procedures: Coronary CTA-see instructions below  Your physician has requested that you have an echocardiogram. Echocardiography is a painless test that uses sound waves to create images of your heart. It provides your doctor with information about the size and shape of your heart and how well your heart's chambers and valves are working. This procedure takes approximately one hour. There are no restrictions for this procedure. Please do NOT wear cologne, perfume, aftershave, or lotions (deodorant is allowed). Please arrive 15 minutes prior to your appointment time.  Follow-Up: At [facility], you and your health needs are our priority.  As part of our continuing mission to provide you with exceptional heart care, we have created designated Provider Care Teams.  These Care Teams include your primary Cardiologist (physician) and Advanced Practice Providers (APPs -  Physician Assistants and Nurse Practitioners) who all work together to provide you with the care you need, when you need it.  We recommend signing up for the patient portal called "MyChart".  Sign up information is provided on this After Visit Summary.  MyChart is used to connect with patients for Virtual Visits (Telemedicine).  Patients are able to view lab/test results, encounter notes, upcoming appointments, etc.  Non-urgent messages can be sent to your provider as well.   To learn more about what you can do with MyChart, go to [email or web address].    Your next appointment:   3 month(s)  Provider:   Little Ishikawa, MD     Other Instructions   Your cardiac CT will be scheduled at one of the below locations:   [facility] [address] [place], [place] [address] [phone]  If scheduled at [facility], please arrive at the [facility] (Entrance C2) of [facility] 30 minutes prior to test start time. You can use the FREE valet parking offered at entrance C (encouraged to control the heart rate for the test)  Proceed to the [facility] Radiology Department (first floor) to check-in and test prep.  All radiology patients and guests should use entrance C2 at [facility], accessed from [facility], even though the hospital's physical address listed is [address]      Please follow these instructions carefully (unless otherwise directed):  Hold all erectile dysfunction medications at least 3 days (72 hrs) prior to test. (Ie viagra, cialis, sildenafil, tadalafil, etc) We will administer nitroglycerin during this exam.   On the Night Before the Test: Be sure to Drink plenty of water. Do not consume any caffeinated/decaffeinated beverages or chocolate 12 hours prior to your test. Do not take any antihistamines 12 hours prior to your test.  On the Day of the Test: Drink plenty of water until 1 hour prior to the test. Do not eat any food 1 hour prior to test. You may take your regular medications prior to the test.  If you take Furosemide/Hydrochlorothiazide/Spironolactone, please HOLD on the morning of the test.  After the Test: Drink plenty of water. After receiving IV contrast, you may experience a mild flushed feeling. This is normal. On occasion, you may experience a mild rash up to 24 hours after the test. This is not dangerous. If this  occurs, you can take Benadryl 25 mg and increase your fluid intake. If you experience trouble breathing, this can be serious. If it is severe call 911 IMMEDIATELY. If it is mild, please call our office. If you take any of these medications: Glipizide/Metformin, Avandament,  Glucavance, please do not take 48 hours after completing test unless otherwise instructed.  We will call to schedule your test 2-4 weeks out understanding that some insurance companies will need an authorization prior to the service being performed.   For non-scheduling related questions, please contact the cardiac imaging nurse navigator should you have any questions/concerns: Rockwell Alexandria, Cardiac Imaging Nurse Navigator Larey Brick, Cardiac Imaging Nurse Navigator [facility] Direct Office Dial: [phone]   For scheduling needs, including cancellations and rescheduling, please call [place], [phone].

## 2022-09-03 ENCOUNTER — Telehealth (HOSPITAL_COMMUNITY): Payer: Self-pay | Admitting: *Deleted

## 2022-09-03 NOTE — Telephone Encounter (Signed)
Reaching out to patient to offer assistance regarding upcoming cardiac imaging study; pt's wife answered phone and verbalizes understanding of appt date/time, parking situation and where to check in, pre-test NPO status, and verified current allergies; name and call back number provided for further questions should they arise  Larey Brick RN Navigator Cardiac Imaging [facility] [phone] office [phone] cell  Patient's wife is aware the patient is to arrive to arrive at 9:30am.

## 2022-09-04 ENCOUNTER — Ambulatory Visit (HOSPITAL_COMMUNITY)
Admission: RE | Admit: 2022-09-04 | Discharge: 2022-09-04 | Disposition: A | Discharge status: Home / Self Care | Payer: Medicare Other | Source: Ambulatory Visit | Attending: Cardiology | Admitting: Cardiology

## 2022-09-04 DIAGNOSIS — R079 Chest pain, unspecified: Secondary | ICD-10-CM | POA: Insufficient documentation

## 2022-09-04 DIAGNOSIS — I2510 Atherosclerotic heart disease of native coronary artery without angina pectoris: Secondary | ICD-10-CM | POA: Diagnosis not present

## 2022-09-04 MED ORDER — IOHEXOL 350 MG/ML SOLN
95.0000 mL | Freq: Once | INTRAVENOUS | Status: AC | PRN
  Administered 2022-09-04: 95 mL via INTRAVENOUS

## 2022-09-04 MED ORDER — NITROGLYCERIN 0.4 MG SL SUBL
0.8000 mg | SUBLINGUAL_TABLET | SUBLINGUAL | Status: DC | PRN
Start: 2022-09-04 — End: 2022-09-05
  Administered 2022-09-04: 0.8 mg via SUBLINGUAL

## 2022-09-04 MED ORDER — NITROGLYCERIN 0.4 MG SL SUBL
SUBLINGUAL_TABLET | SUBLINGUAL | Status: AC
  Filled 2022-09-04: qty 2

## 2022-09-04 NOTE — Progress Notes (Signed)
Patient tolerated without distress 

## 2022-09-05 ENCOUNTER — Other Ambulatory Visit (HOSPITAL_COMMUNITY): Payer: Self-pay | Admitting: *Deleted

## 2022-09-05 ENCOUNTER — Telehealth: Payer: Self-pay | Admitting: Cardiology

## 2022-09-05 ENCOUNTER — Ambulatory Visit (HOSPITAL_COMMUNITY)
Admission: RE | Admit: 2022-09-05 | Discharge: 2022-09-05 | Disposition: A | Discharge status: Home / Self Care | Payer: Medicare Other | Source: Ambulatory Visit | Attending: Cardiology | Admitting: Cardiology

## 2022-09-05 DIAGNOSIS — R931 Abnormal findings on diagnostic imaging of heart and coronary circulation: Secondary | ICD-10-CM

## 2022-09-05 MED ORDER — ROSUVASTATIN CALCIUM 20 MG PO TABS
20.0000 mg | ORAL_TABLET | Freq: Every day | ORAL | 3 refills | Status: DC
Start: 2022-09-05 — End: 2023-08-27

## 2022-09-05 NOTE — Telephone Encounter (Signed)
Wife wants call back to discuss patient's test results.

## 2022-09-05 NOTE — Telephone Encounter (Signed)
Little Ishikawa, MD      Plaque in heart arteries but no significant blockages.  Recommend increasing rosuvastatin to 20 mg daily and check fasting lipid panel in 2 months.  Would add aspirin 81 mg daily    Spoke with pt wife, aware of CT results and dr schumann's recommendations. Aware the over read is not available yet. New script sent to the pharmacy and Lab orders mailed to the pt

## 2022-09-12 DIAGNOSIS — K080 Exfoliation of teeth due to systemic causes: Secondary | ICD-10-CM | POA: Diagnosis not present

## 2022-09-14 ENCOUNTER — Ambulatory Visit (HOSPITAL_COMMUNITY): Payer: Medicare Other | Attending: Cardiology

## 2022-09-14 DIAGNOSIS — R079 Chest pain, unspecified: Secondary | ICD-10-CM | POA: Diagnosis not present

## 2022-09-14 LAB — ECHOCARDIOGRAM COMPLETE
Area-P 1/2: 2.36 cm2
S' Lateral: 2.7 cm

## 2022-09-17 ENCOUNTER — Telehealth: Payer: Self-pay | Admitting: Cardiology

## 2022-09-17 NOTE — Telephone Encounter (Signed)
Pt's wife calling to f/u on Echo and CT results. Please advise

## 2022-09-17 NOTE — Telephone Encounter (Signed)
Wife states they received both echo and CT results. She would like to know what happens now with patient chest pain. He is nit currently having chest pain but she states it does comes and goes.

## 2022-09-18 NOTE — Telephone Encounter (Signed)
Good news is no blockages in heart arteries to explain chest pain.  If still having pain, could be microvascular dysfunction.  Can we add him on 6/17 at 10:40 to discuss next steps?

## 2022-09-19 DIAGNOSIS — E785 Hyperlipidemia, unspecified: Secondary | ICD-10-CM | POA: Diagnosis not present

## 2022-09-19 DIAGNOSIS — R079 Chest pain, unspecified: Secondary | ICD-10-CM | POA: Diagnosis not present

## 2022-09-19 NOTE — Telephone Encounter (Signed)
Spoke to patient's wife Dr.Schumann's advice given.Appointment scheduled with Dr.Schumann 6/17 at 10:40 am.Wife stated husband saw PCP Dr.Hammer this morning.Dr.Hammer ordered a stress test.She wanted to ask Dr.Schumann if he needs to have done.I will send message to Dr.Schumann.

## 2022-09-20 ENCOUNTER — Other Ambulatory Visit (HOSPITAL_COMMUNITY): Payer: Self-pay | Admitting: Family Medicine

## 2022-09-20 DIAGNOSIS — R079 Chest pain, unspecified: Secondary | ICD-10-CM

## 2022-09-20 NOTE — Telephone Encounter (Signed)
Spoke to patient's wife Dr.Schumann's advice given.

## 2022-09-20 NOTE — Telephone Encounter (Signed)
Recommend holding off on stress test, would f/u in clinic as scheduled

## 2022-09-30 NOTE — Progress Notes (Unsigned)
Cardiology Office Note:    Date:  10/01/2022   ID:  Ulis Rias, DOB 12-19-1955, MRN [id number]  PCP:  Irven Coe, MD  Cardiologist:  Little Ishikawa, MD  Electrophysiologist:  None   Referring MD: Irven Coe, MD   Chief Complaint  Patient presents with   Chest Pain    History of Present Illness:    Storm Brandsma is a 67 y.o. male with a hx of hyperlipidemia, glaucoma who presents for.  He was referred by Dr. Lewie Chamber for evaluation of chest pain, initially seen on 08/23/2022.  He reports that over the last 6 months he has noted that he tires easily.  He works in Scientist, research (physical sciences) and states that about a month ago he was digging a ditch and felt chest pain.  Describes a tightness in the center chest as he was working.  States that he stopped, and chest pain resolved after about 1 minute.  Denies any chest pain since that time but reports he has not been exerting himself that much.  Does report he gets short of breath, particular with walking up stairs.  Reports some lightheadedness but denies any syncope.  Denies any lower extremity edema or palpitations.  He chewed tobacco for 15 to 20 years, quit in his 30s.  Family history includes father had CABG at age 57, and paternal grandfather died of MI in 45s.  Labs on 2022/08/25 showed creatinine 1.05, hemoglobin 13.9, LDL 97.  Coronary CTA on 09/04/2022 showed moderate mid LAD stenosis, not significant by FFR; calcium score 12 (26 percentile).  Echocardiogram 09/14/2022 showed normal biventricular function, no significant valvular disease.  Since last clinic visit, he reports that he is doing okay.  He continues to have chest pain if working in sun, describes as pressure in his chest.  He has not used the nitroglycerin.  Also feels short of breath.  Denies any syncope.  Past Medical History:  Diagnosis Date   Cataracts, bilateral    Glaucoma     Past Surgical History:  Procedure Laterality Date   APPENDECTOMY  1961    Current  Medications: Current Meds  Medication Sig   ALPHAGAN P 0.1 % SOLN Place 1 drop into both eyes 2 (two) times daily.    aspirin EC 81 MG tablet Take 1 tablet (81 mg total) by mouth daily. Swallow whole.   isosorbide mononitrate (IMDUR) 30 MG 24 hr tablet Take 1/2 tablet ( 15 mg ) daily   nitroGLYCERIN (NITROSTAT) 0.4 MG SL tablet Place 0.4 mg under the tongue as needed for chest pain.   rosuvastatin (CRESTOR) 20 MG tablet Take 1 tablet (20 mg total) by mouth daily.   Current Facility-Administered Medications for the 10/01/22 encounter (Office Visit) with Little Ishikawa, MD  Medication   0.9 %  sodium chloride infusion     Allergies:   Patient has no known allergies.   Social History   Socioeconomic History   Marital status: Married    Spouse name: Not on file   Number of children: Not on file   Years of education: Not on file   Highest education level: Not on file  Occupational History   Not on file  Tobacco Use   Smoking status: Never   Smokeless tobacco: Former    Types: Chew  Substance and Sexual Activity   Alcohol use: No   Drug use: No   Sexual activity: Not on file  Other Topics Concern   Not on file  Social History Narrative  Married.   Works as a Academic librarian.   Highest level of education: high school.    Social Determinants of Health   Financial Resource Strain: Not on file  Food Insecurity: Not on file  Transportation Needs: Not on file  Physical Activity: Not on file  Stress: Not on file  Social Connections: Not on file     Family History: The patient's family history includes Heart disease in his father. There is no history of Colon cancer, Esophageal cancer, Stomach cancer, or Pancreatic cancer.  ROS:   Please see the history of present illness.     All other systems reviewed and are negative.  EKGs/Labs/Other Studies Reviewed:    The following studies were reviewed today:   EKG:   08/23/2022 sinus bradycardia, rate 51, no ST  abnormality  Recent Labs: No results found for requested labs within last 365 days.  Recent Lipid Panel    Component Value Date/Time   CHOL 179 05/15/2016 1518   TRIG 168.0 (H) 05/15/2016 1518   HDL 30.70 (L) 05/15/2016 1518   CHOLHDL 6 05/15/2016 1518   VLDL 33.6 05/15/2016 1518   LDLCALC 115 (H) 05/15/2016 1518    Physical Exam:    VS:  BP 122/70 (BP Location: Left Arm, Patient Position: Sitting, Cuff Size: Normal)   Pulse (!) 52   Ht 6' (1.829 m)   Wt 181 lb (82.1 kg)   SpO2 98%   BMI 24.55 kg/m     Wt Readings from Last 3 Encounters:  10/01/22 181 lb (82.1 kg)  08/23/22 182 lb (82.6 kg)  11/16/16 190 lb (86.2 kg)     GEN:  Well nourished, well developed in no acute distress HEENT: Normal NECK: No JVD; No carotid bruits LYMPHATICS: No lymphadenopathy CARDIAC: RRR, no murmurs, rubs, gallops RESPIRATORY:  Clear to auscultation without rales, wheezing or rhonchi  ABDOMEN: Soft, non-tender, non-distended MUSCULOSKELETAL:  No edema; No deformity  SKIN: Warm and dry NEUROLOGIC:  Alert and oriented x 3 PSYCHIATRIC:  Normal affect   ASSESSMENT:    1. Chest pain of uncertain etiology   2. Coronary artery disease involving native coronary artery of native heart, unspecified whether angina present   3. Hyperlipidemia, unspecified hyperlipidemia type     PLAN:    CAD: Reported chest pain suggesting typical angina, as describes exertional chest pressure that resolves with rest.  Coronary CTA on 09/04/2022 showed moderate mid LAD stenosis, not significant by FFR; calcium score 12 (26 percentile).  Echocardiogram 09/14/2022 showed normal biventricular function, no significant valvular disease. 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  Can obtain stress PET to confirm.  In the meantime, would trial medication to help with symptoms.  Low resting heart rate, would not likely tolerate beta-blocker.  Will  start Imdur 15 mg daily -Continue aspirin, statin  Hyperlipidemia: On rosuvastatin 10 mg daily.  LDL 97 on 08/14/2022.  Given coronary CTA results as above, rosuvastatin dose increased to 20 mg daily for goal LDL less than 70.  Repeat fasting lipid panel in 2 months  RTC in 3 months   Medication Adjustments/Labs and Tests Ordered: Current medicines are reviewed at length with the patient today.  Concerns regarding medicines are outlined above.  Orders Placed This Encounter  Procedures   NM PET CT CARDIAC PERFUSION MULTI W/ABSOLUTE BLOODFLOW   Meds ordered this encounter  Medications   isosorbide mononitrate (IMDUR) 30 MG 24 hr tablet    Sig: Take 1/2  tablet ( 15 mg ) daily    Dispense:  45 tablet    Refill:  3    Patient Instructions  Medication Instructions:  Start Imdur 15 mg daily Continue all other medications *If you need a refill on your cardiac medications before your next appointment, please call your pharmacy*   Lab Work: None ordered   Testing/Procedures: Cardiac Pet Scan will be scheduled after approved by insurance  Follow instructions below   Follow-Up: At [facility], you and your health needs are our priority.  As part of our continuing mission to provide you with exceptional heart care, we have created designated Provider Care Teams.  These Care Teams include your primary Cardiologist (physician) and Advanced Practice Providers (APPs -  Physician Assistants and Nurse Practitioners) who all work together to provide you with the care you need, when you need it.  We recommend signing up for the patient portal called "MyChart".  Sign up information is provided on this After Visit Summary.  MyChart is used to connect with patients for Virtual Visits (Telemedicine).  Patients are able to view lab/test results, encounter notes, upcoming appointments, etc.  Non-urgent messages can be sent to your provider as well.   To learn more about what you can do with  MyChart, go to [email or web address].    Your next appointment:  Keep appointment already scheduled Thurs 8/1 at 8:40 am    Provider:  Dr.Asahd Can    How to Prepare for Your Cardiac PET/CT Stress Test:  1. Please do not take these medications before your test:   Medications that may interfere with the cardiac pharmacological stress agent (ex. nitrates - including erectile dysfunction medications, isosorbide mononitrate, tamulosin or beta-blockers) the day of the exam. (Erectile dysfunction medication should be held for at least 72 hrs prior to test) Theophylline containing medications for 12 hours. Dipyridamole 48 hours prior to the test. Your remaining medications may be taken with water.  2. Nothing to eat or drink, except water, 3 hours prior to arrival time.   NO caffeine/decaffeinated products, or chocolate 12 hours prior to arrival.  3. NO perfume, cologne or lotion  4. Total time is 1 to 2 hours; you may want to bring reading material for the waiting time.  5. Please report to Radiology at the [facility] Main Entrance 30 minutes early for your test.  [address] [place], [place] [address]    In preparation for your appointment, medication and supplies will be purchased.  Appointment availability is limited, so if you need to cancel or reschedule, please call the Radiology Department at [phone]  24 hours in advance to avoid a cancellation fee of $100.00  What to Expect After you Arrive:  Once you arrive and check in for your appointment, you will be taken to a preparation room within the Radiology Department.  A technologist or Nurse will obtain your medical history, verify that you are correctly prepped for the exam, and explain the procedure.  Afterwards,  an IV will be started in your arm and electrodes will be placed on your skin for EKG monitoring during the stress portion of the exam. Then you will be escorted to the PET/CT scanner.  There, staff  will get you positioned on the scanner and obtain a blood pressure and EKG.  During the exam, you will continue to be connected to the EKG and blood pressure machines.  A small, safe amount of a radioactive tracer will be injected in your  IV to obtain a series of pictures of your heart along with an injection of a stress agent.    After your Exam:  It is recommended that you eat a meal and drink a caffeinated beverage to counter act any effects of the stress agent.  Drink plenty of fluids for the remainder of the day and urinate frequently for the first couple of hours after the exam.  Your doctor will inform you of your test results within 7-10 business days.  For questions about your test or how to prepare for your test, please call: Rockwell Alexandria, Cardiac Imaging Nurse Navigator  Larey Brick, Cardiac Imaging Nurse Navigator Office: [phone]      Signed, Little Ishikawa, MD  10/01/2022 1:22 PM    [facility]

## 2022-10-01 ENCOUNTER — Ambulatory Visit: Payer: Medicare Other | Attending: Cardiology | Admitting: Cardiology

## 2022-10-01 ENCOUNTER — Encounter: Payer: Self-pay | Admitting: Cardiology

## 2022-10-01 VITALS — BP 122/70 | HR 52 | Ht 72.0 in | Wt 181.0 lb

## 2022-10-01 DIAGNOSIS — I2510 Atherosclerotic heart disease of native coronary artery without angina pectoris: Secondary | ICD-10-CM

## 2022-10-01 DIAGNOSIS — R079 Chest pain, unspecified: Secondary | ICD-10-CM | POA: Diagnosis not present

## 2022-10-01 DIAGNOSIS — E785 Hyperlipidemia, unspecified: Secondary | ICD-10-CM

## 2022-10-01 MED ORDER — ISOSORBIDE MONONITRATE ER 30 MG PO TB24: ORAL_TABLET | ORAL | 3 refills | Status: DC

## 2022-10-01 NOTE — Patient Instructions (Addendum)
Medication Instructions:  Start Imdur 15 mg daily Continue all other medications *If you need a refill on your cardiac medications before your next appointment, please call your pharmacy*   Lab Work: None ordered   Testing/Procedures: Cardiac Pet Scan will be scheduled after approved by insurance  Follow instructions below   Follow-Up: At [facility], you and your health needs are our priority.  As part of our continuing mission to provide you with exceptional heart care, we have created designated Provider Care Teams.  These Care Teams include your primary Cardiologist (physician) and Advanced Practice Providers (APPs -  Physician Assistants and Nurse Practitioners) who all work together to provide you with the care you need, when you need it.  We recommend signing up for the patient portal called "MyChart".  Sign up information is provided on this After Visit Summary.  MyChart is used to connect with patients for Virtual Visits (Telemedicine).  Patients are able to view lab/test results, encounter notes, upcoming appointments, etc.  Non-urgent messages can be sent to your provider as well.   To learn more about what you can do with MyChart, go to [email or web address].    Your next appointment:  Keep appointment already scheduled Thurs 8/1 at 8:40 am    Provider:  Dr.Schumann    How to Prepare for Your Cardiac PET/CT Stress Test:  1. Please do not take these medications before your test:   Medications that may interfere with the cardiac pharmacological stress agent (ex. nitrates - including erectile dysfunction medications, isosorbide mononitrate, tamulosin or beta-blockers) the day of the exam. (Erectile dysfunction medication should be held for at least 72 hrs prior to test) Theophylline containing medications for 12 hours. Dipyridamole 48 hours prior to the test. Your remaining medications may be taken with water.  2. Nothing to eat or drink, except water, 3 hours  prior to arrival time.   NO caffeine/decaffeinated products, or chocolate 12 hours prior to arrival.  3. NO perfume, cologne or lotion  4. Total time is 1 to 2 hours; you may want to bring reading material for the waiting time.  5. Please report to Radiology at the [facility] Main Entrance 30 minutes early for your test.  [address] [place], [place] [address]    In preparation for your appointment, medication and supplies will be purchased.  Appointment availability is limited, so if you need to cancel or reschedule, please call the Radiology Department at [phone]  24 hours in advance to avoid a cancellation fee of $100.00  What to Expect After you Arrive:  Once you arrive and check in for your appointment, you will be taken to a preparation room within the Radiology Department.  A technologist or Nurse will obtain your medical history, verify that you are correctly prepped for the exam, and explain the procedure.  Afterwards,  an IV will be started in your arm and electrodes will be placed on your skin for EKG monitoring during the stress portion of the exam. Then you will be escorted to the PET/CT scanner.  There, staff will get you positioned on the scanner and obtain a blood pressure and EKG.  During the exam, you will continue to be connected to the EKG and blood pressure machines.  A small, safe amount of a radioactive tracer will be injected in your IV to obtain a series of pictures of your heart along with an injection of a stress agent.    After your Exam:  It  is recommended that you eat a meal and drink a caffeinated beverage to counter act any effects of the stress agent.  Drink plenty of fluids for the remainder of the day and urinate frequently for the first couple of hours after the exam.  Your doctor will inform you of your test results within 7-10 business days.  For questions about your test or how to prepare for your test, please call: Rockwell Alexandria,  Cardiac Imaging Nurse Navigator  Larey Brick, Cardiac Imaging Nurse Navigator Office: [phone]

## 2022-11-08 DIAGNOSIS — K080 Exfoliation of teeth due to systemic causes: Secondary | ICD-10-CM | POA: Diagnosis not present

## 2022-11-09 DIAGNOSIS — R931 Abnormal findings on diagnostic imaging of heart and coronary circulation: Secondary | ICD-10-CM | POA: Diagnosis not present

## 2022-11-12 ENCOUNTER — Other Ambulatory Visit: Payer: Self-pay

## 2022-11-12 DIAGNOSIS — E785 Hyperlipidemia, unspecified: Secondary | ICD-10-CM

## 2022-11-12 DIAGNOSIS — I2510 Atherosclerotic heart disease of native coronary artery without angina pectoris: Secondary | ICD-10-CM

## 2022-11-12 MED ORDER — EZETIMIBE 10 MG PO TABS: 10.0000 mg | ORAL_TABLET | Freq: Every day | ORAL | 3 refills | Status: DC

## 2022-11-12 NOTE — Progress Notes (Unsigned)
Cardiology Office Note:    Date:  11/15/2022   ID:  Ulis Rias, DOB 02/19/1956, MRN [id number]  PCP:  Irven Coe, MD  Cardiologist:  Little Ishikawa, MD  Electrophysiologist:  None   Referring MD: Doreene Nest, NP   Chief Complaint  Patient presents with   Chest Pain    History of Present Illness:    Thomas Curry is a 67 y.o. male with a hx of hyperlipidemia, glaucoma who presents for.  He was referred by Dr. Lewie Chamber for evaluation of chest pain, initially seen on 08/23/2022.  He reports that over the last 6 months he has noted that he tires easily.  He works in Scientist, research (physical sciences) and states that about a month ago he was digging a ditch and felt chest pain.  Describes a tightness in the center chest as he was working.  States that he stopped, and chest pain resolved after about 1 minute.  Denies any chest pain since that time but reports he has not been exerting himself that much.  Does report he gets short of breath, particular with walking up stairs.  Reports some lightheadedness but denies any syncope.  Denies any lower extremity edema or palpitations.  He chewed tobacco for 15 to 20 years, quit in his 30s.  Family history includes father had CABG at age 27, and paternal grandfather died of MI in 88s.  Labs on Sep 02, 2022 showed creatinine 1.05, hemoglobin 13.9, LDL 97.  Coronary CTA on 09/04/2022 showed moderate mid LAD stenosis, not significant by FFR; calcium score 12 (26 percentile).  Echocardiogram 09/14/2022 showed normal biventricular function, no significant valvular disease.  Since last clinic visit, he reports he is doing significantly better.  States that his chest pain resolved when he started Imdur.  He is able to do more at work and not having any chest pain.  Denies any dyspnea, lightheadedness, syncope, lower extremity edema, or palpitations.    Past Medical History:  Diagnosis Date   Cataracts, bilateral    Glaucoma     Past Surgical History:  Procedure  Laterality Date   APPENDECTOMY  1961    Current Medications: Current Meds  Medication Sig   ALPHAGAN P 0.1 % SOLN Place 1 drop into both eyes 2 (two) times daily.    aspirin EC 81 MG tablet Take 1 tablet (81 mg total) by mouth daily. Swallow whole.   ezetimibe (ZETIA) 10 MG tablet Take 1 tablet (10 mg total) by mouth daily.   isosorbide mononitrate (IMDUR) 30 MG 24 hr tablet Take 1/2 tablet ( 15 mg ) daily   nitroGLYCERIN (NITROSTAT) 0.4 MG SL tablet Place 0.4 mg under the tongue as needed for chest pain.   oseltamivir (TAMIFLU) 75 MG capsule Take 1 capsule (75 mg total) by mouth 2 (two) times daily.   rosuvastatin (CRESTOR) 20 MG tablet Take 1 tablet (20 mg total) by mouth daily.   timolol (TIMOPTIC) 0.5 % ophthalmic solution Place 1 drop into both eyes daily.   Current Facility-Administered Medications for the 11/15/22 encounter (Office Visit) with Little Ishikawa, MD  Medication   0.9 %  sodium chloride infusion     Allergies:   Patient has no known allergies.   Social History   Socioeconomic History   Marital status: Married    Spouse name: Not on file   Number of children: Not on file   Years of education: Not on file   Highest education level: Not on file  Occupational History  Not on file  Tobacco Use   Smoking status: Never   Smokeless tobacco: Former    Types: Chew  Substance and Sexual Activity   Alcohol use: No   Drug use: No   Sexual activity: Not on file  Other Topics Concern   Not on file  Social History Narrative   Married.   Works as a Academic librarian.   Highest level of education: high school.    Social Determinants of Health   Financial Resource Strain: Not on file  Food Insecurity: Not on file  Transportation Needs: Not on file  Physical Activity: Not on file  Stress: Not on file  Social Connections: Not on file     Family History: The patient's family history includes Heart disease in his father. There is no history of Colon cancer,  Esophageal cancer, Stomach cancer, or Pancreatic cancer.  ROS:   Please see the history of present illness.     All other systems reviewed and are negative.  EKGs/Labs/Other Studies Reviewed:    The following studies were reviewed today:   EKG:   08/23/2022 sinus bradycardia, rate 51, no ST abnormality  Recent Labs: No results found for requested labs within last 365 days.  Recent Lipid Panel    Component Value Date/Time   CHOL 147 11/09/2022 0813   TRIG 89 11/09/2022 0813   HDL 43 11/09/2022 0813   CHOLHDL 3.4 11/09/2022 0813   CHOLHDL 6 05/15/2016 1518   VLDL 33.6 05/15/2016 1518   LDLCALC 87 11/09/2022 0813    Physical Exam:    VS:  BP 118/74 (BP Location: Left Arm, Patient Position: Sitting, Cuff Size: Normal)   Pulse (!) 55   Ht 6' (1.829 m)   Wt 191 lb 9.6 oz (86.9 kg)   SpO2 95%   BMI 25.99 kg/m     Wt Readings from Last 3 Encounters:  11/15/22 191 lb 9.6 oz (86.9 kg)  10/01/22 181 lb (82.1 kg)  08/23/22 182 lb (82.6 kg)     GEN:  Well nourished, well developed in no acute distress HEENT: Normal NECK: No JVD; No carotid bruits LYMPHATICS: No lymphadenopathy CARDIAC: RRR, no murmurs, rubs, gallops RESPIRATORY:  Clear to auscultation without rales, wheezing or rhonchi  ABDOMEN: Soft, non-tender, non-distended MUSCULOSKELETAL:  No edema; No deformity  SKIN: Warm and dry NEUROLOGIC:  Alert and oriented x 3 PSYCHIATRIC:  Normal affect   ASSESSMENT:    1. Coronary artery disease involving native coronary artery of native heart, unspecified whether angina present   2. Hyperlipidemia, unspecified hyperlipidemia type     PLAN:    CAD: Reported chest pain suggesting typical angina, as describes exertional chest pressure that resolves with rest.  Coronary CTA on 09/04/2022 showed moderate mid LAD stenosis, not significant by FFR; calcium score 12 (26 percentile).  Echocardiogram 09/14/2022 showed normal biventricular function, no significant valvular  disease. 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  Started on Imdur 15 mg daily and he reports resolution of his chest pain.  Could consider cardiac PET to confirm microvascular disease, but he wishes to hold off for now as his chest pain has resolved.  Will continue Imdur -Continue aspirin, statin  Hyperlipidemia: On rosuvastatin 10 mg daily.  LDL 97 on 08/14/2022.  Given coronary CTA results as above, rosuvastatin dose increased to 20 mg daily for goal LDL less than 70.  LDL 87 on 11/09/2022, add Zetia 10 mg daily.  Repeat fasting lipid  panel in 2 months  RTC in 4 months   Medication Adjustments/Labs and Tests Ordered: Current medicines are reviewed at length with the patient today.  Concerns regarding medicines are outlined above.  No orders of the defined types were placed in this encounter.  No orders of the defined types were placed in this encounter.   Patient Instructions  Medication Instructions:  Continue same medications *If you need a refill on your cardiac medications before your next appointment, please call your pharmacy*   Lab Work: Have a fastin lipid panel in 2 months Oct    Testing/Procedures: Stress Pet Scan will be cancelled   Follow-Up: At [facility], you and your health needs are our priority.  As part of our continuing mission to provide you with exceptional heart care, we have created designated Provider Care Teams.  These Care Teams include your primary Cardiologist (physician) and Advanced Practice Providers (APPs -  Physician Assistants and Nurse Practitioners) who all work together to provide you with the care you need, when you need it.  We recommend signing up for the patient portal called "MyChart".  Sign up information is provided on this After Visit Summary.  MyChart is used to connect with patients for Virtual Visits (Telemedicine).  Patients are able to view lab/test  results, encounter notes, upcoming appointments, etc.  Non-urgent messages can be sent to your provider as well.   To learn more about what you can do with MyChart, go to [email or web address].    Your next appointment:  4 months    Provider:  Dr.Novia Lansberry     Signed, Little Ishikawa, MD  11/15/2022 8:59 AM    [facility]

## 2022-11-15 ENCOUNTER — Encounter: Payer: Self-pay | Admitting: Cardiology

## 2022-11-15 ENCOUNTER — Ambulatory Visit: Payer: Medicare Other | Attending: Cardiology | Admitting: Cardiology

## 2022-11-15 VITALS — BP 118/74 | HR 55 | Ht 72.0 in | Wt 191.6 lb

## 2022-11-15 DIAGNOSIS — I2510 Atherosclerotic heart disease of native coronary artery without angina pectoris: Secondary | ICD-10-CM | POA: Diagnosis not present

## 2022-11-15 DIAGNOSIS — E785 Hyperlipidemia, unspecified: Secondary | ICD-10-CM | POA: Diagnosis not present

## 2022-11-15 NOTE — Patient Instructions (Signed)
Medication Instructions:  Continue same medications *If you need a refill on your cardiac medications before your next appointment, please call your pharmacy*   Lab Work: Have a fastin lipid panel in 2 months Oct    Testing/Procedures: Stress Pet Scan will be cancelled   Follow-Up: At [facility], you and your health needs are our priority.  As part of our continuing mission to provide you with exceptional heart care, we have created designated Provider Care Teams.  These Care Teams include your primary Cardiologist (physician) and Advanced Practice Providers (APPs -  Physician Assistants and Nurse Practitioners) who all work together to provide you with the care you need, when you need it.  We recommend signing up for the patient portal called "MyChart".  Sign up information is provided on this After Visit Summary.  MyChart is used to connect with patients for Virtual Visits (Telemedicine).  Patients are able to view lab/test results, encounter notes, upcoming appointments, etc.  Non-urgent messages can be sent to your provider as well.   To learn more about what you can do with MyChart, go to [email or web address].    Your next appointment:  4 months    Provider:  Dr.Schumann

## 2022-11-19 ENCOUNTER — Other Ambulatory Visit (HOSPITAL_COMMUNITY): Payer: Self-pay | Admitting: *Deleted

## 2022-11-19 ENCOUNTER — Encounter (HOSPITAL_COMMUNITY): Payer: Self-pay

## 2022-11-19 DIAGNOSIS — R079 Chest pain, unspecified: Secondary | ICD-10-CM

## 2022-11-21 ENCOUNTER — Encounter (HOSPITAL_COMMUNITY): Admission: RE | Admit: 2022-11-21 | Payer: Medicare Other | Source: Ambulatory Visit

## 2022-12-03 DIAGNOSIS — I25119 Atherosclerotic heart disease of native coronary artery with unspecified angina pectoris: Secondary | ICD-10-CM | POA: Diagnosis not present

## 2022-12-03 DIAGNOSIS — E785 Hyperlipidemia, unspecified: Secondary | ICD-10-CM | POA: Diagnosis not present

## 2022-12-03 DIAGNOSIS — Z125 Encounter for screening for malignant neoplasm of prostate: Secondary | ICD-10-CM | POA: Diagnosis not present

## 2022-12-03 DIAGNOSIS — Z0000 Encounter for general adult medical examination without abnormal findings: Secondary | ICD-10-CM | POA: Diagnosis not present

## 2022-12-03 DIAGNOSIS — H409 Unspecified glaucoma: Secondary | ICD-10-CM | POA: Diagnosis not present

## 2023-01-02 DIAGNOSIS — H40023 Open angle with borderline findings, high risk, bilateral: Secondary | ICD-10-CM | POA: Diagnosis not present

## 2023-01-02 DIAGNOSIS — H2512 Age-related nuclear cataract, left eye: Secondary | ICD-10-CM | POA: Diagnosis not present

## 2023-01-02 DIAGNOSIS — H21233 Degeneration of iris (pigmentary), bilateral: Secondary | ICD-10-CM | POA: Diagnosis not present

## 2023-01-02 DIAGNOSIS — Z961 Presence of intraocular lens: Secondary | ICD-10-CM | POA: Diagnosis not present

## 2023-02-04 DIAGNOSIS — H40013 Open angle with borderline findings, low risk, bilateral: Secondary | ICD-10-CM | POA: Diagnosis not present

## 2023-02-04 DIAGNOSIS — H40053 Ocular hypertension, bilateral: Secondary | ICD-10-CM | POA: Diagnosis not present

## 2023-02-04 DIAGNOSIS — H2512 Age-related nuclear cataract, left eye: Secondary | ICD-10-CM | POA: Diagnosis not present

## 2023-02-12 DIAGNOSIS — I2510 Atherosclerotic heart disease of native coronary artery without angina pectoris: Secondary | ICD-10-CM | POA: Diagnosis not present

## 2023-02-12 DIAGNOSIS — E785 Hyperlipidemia, unspecified: Secondary | ICD-10-CM | POA: Diagnosis not present

## 2023-02-13 LAB — LIPID PANEL
Chol/HDL Ratio: 2.9 {ratio} (ref 0.0–5.0)
Cholesterol, Total: 124 mg/dL (ref 100–199)
HDL: 43 mg/dL (ref >39)
LDL Chol Calc (NIH): 64 mg/dL (ref 0–99)
Triglycerides: 85 mg/dL (ref 0–149)
VLDL Cholesterol Cal: 17 mg/dL (ref 5–40)

## 2023-02-16 DIAGNOSIS — Z23 Encounter for immunization: Secondary | ICD-10-CM | POA: Diagnosis not present

## 2023-03-27 DIAGNOSIS — K080 Exfoliation of teeth due to systemic causes: Secondary | ICD-10-CM | POA: Diagnosis not present

## 2023-03-31 NOTE — Progress Notes (Unsigned)
Cardiology Office Note:    Date:  04/02/2023   ID:  Thomas Curry, DOB [birth year], MRN [id number]  PCP:  Irven Coe, MD  Cardiologist:  Little Ishikawa, MD  Electrophysiologist:  None   Referring MD: Irven Coe, MD   Chief Complaint  Patient presents with   Coronary Artery Disease    History of Present Illness:    Thomas Curry is a 67 y.o. male with a hx of CAD, hyperlipidemia, glaucoma who presents for follow-up.  He was referred by Dr. Lewie Chamber for evaluation of chest pain, initially seen on 08/23/2022.   Coronary CTA on 09/04/2022 showed moderate mid LAD stenosis, not significant by FFR; calcium score 12 (26 percentile).  Echocardiogram 09/14/2022 showed normal biventricular function, no significant valvular disease.  Since last clinic visit, he reports he is doing well.  Denies any chest pain, dyspnea, lightheadedness, syncope, lower extremity edema, or palpitations.  Has not been exercising but very active at work  Past Medical History:  Diagnosis Date   Cataracts, bilateral    Glaucoma     Past Surgical History:  Procedure Laterality Date   APPENDECTOMY  1961    Current Medications: Current Meds  Medication Sig   ALPHAGAN P 0.1 % SOLN Place 1 drop into both eyes 2 (two) times daily.    aspirin EC 81 MG tablet Take 1 tablet (81 mg total) by mouth daily. Swallow whole.   isosorbide mononitrate (IMDUR) 30 MG 24 hr tablet Take 1/2 tablet ( 15 mg ) daily   nitroGLYCERIN (NITROSTAT) 0.4 MG SL tablet Place 0.4 mg under the tongue as needed for chest pain.   rosuvastatin (CRESTOR) 20 MG tablet Take 1 tablet (20 mg total) by mouth daily.   timolol (TIMOPTIC) 0.5 % ophthalmic solution Place 1 drop into both eyes daily.   Current Facility-Administered Medications for the 04/02/23 encounter (Office Visit) with Little Ishikawa, MD  Medication   0.9 %  sodium chloride infusion     Allergies:   Patient has no known allergies.   Social History   Socioeconomic  History   Marital status: Married    Spouse name: Not on file   Number of children: Not on file   Years of education: Not on file   Highest education level: Not on file  Occupational History   Not on file  Tobacco Use   Smoking status: Never   Smokeless tobacco: Former    Types: Chew  Substance and Sexual Activity   Alcohol use: No   Drug use: No   Sexual activity: Not on file  Other Topics Concern   Not on file  Social History Narrative   Married.   Works as a Academic librarian.   Highest level of education: high school.    Social Drivers of Corporate investment banker Strain: Not on file  Food Insecurity: Not on file  Transportation Needs: Not on file  Physical Activity: Not on file  Stress: Not on file  Social Connections: Not on file     Family History: The patient's family history includes Heart disease in his father. There is no history of Colon cancer, Esophageal cancer, Stomach cancer, or Pancreatic cancer.  ROS:   Please see the history of present illness.     All other systems reviewed and are negative.  EKGs/Labs/Other Studies Reviewed:    The following studies were reviewed today:   EKG:   08/23/2022 sinus bradycardia, rate 51, no ST abnormality 04/02/2023: Normal sinus rhythm, rate  61, no ST abnormalities   Recent Labs: No results found for requested labs within last 365 days.  Recent Lipid Panel    Component Value Date/Time   CHOL 124 02/12/2023 0855   TRIG 85 02/12/2023 0855   HDL 43 02/12/2023 0855   CHOLHDL 2.9 02/12/2023 0855   CHOLHDL 6 05/15/2016 1518   VLDL 33.6 05/15/2016 1518   LDLCALC 64 02/12/2023 0855    Physical Exam:    VS:  BP (!) 86/60   Pulse 61   Ht 6' (1.829 m)   Wt 191 lb (86.6 kg)   SpO2 95%   BMI 25.90 kg/m     Wt Readings from Last 3 Encounters:  04/02/23 191 lb (86.6 kg)  11/15/22 191 lb 9.6 oz (86.9 kg)  10/01/22 181 lb (82.1 kg)     GEN:  Well nourished, well developed in no acute distress HEENT:  Normal NECK: No JVD; No carotid bruits CARDIAC: RRR, no murmurs, rubs, gallops RESPIRATORY:  Clear to auscultation without rales, wheezing or rhonchi  ABDOMEN: Soft, non-tender, non-distended MUSCULOSKELETAL:  No edema; No deformity  SKIN: Warm and dry NEUROLOGIC:  Alert and oriented x 3 PSYCHIATRIC:  Normal affect   ASSESSMENT:    1. Coronary artery disease involving native coronary artery of native heart, unspecified whether angina present   2. Hyperlipidemia, unspecified hyperlipidemia type      PLAN:    CAD: Reported chest pain suggesting typical angina, as describes exertional chest pressure that resolves with rest.  Coronary CTA on 09/04/2022 showed moderate mid LAD stenosis, not significant by FFR; calcium score 12 (26 percentile).  Echocardiogram 09/14/2022 showed normal biventricular function, no significant valvular disease. 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  Started on Imdur 15 mg daily and he reports resolution of his chest pain.  Could consider cardiac PET to confirm microvascular disease, but he wishes to hold off for now as his chest pain has resolved.  Will continue Imdur; BP soft in clinic today (86/60, but improved to 100/62 on recheck), he denies any symptoms -Continue aspirin, statin  Hyperlipidemia: On rosuvastatin 10 mg daily.  LDL 97 on 08/14/2022.  Given coronary CTA results as above, rosuvastatin dose increased to 20 mg daily for goal LDL less than 70.  LDL 87 on 11/09/2022, add Zetia 10 mg daily.  LDL 64 on 02/12/2023  RTC in 6 months   Medication Adjustments/Labs and Tests Ordered: Current medicines are reviewed at length with the patient today.  Concerns regarding medicines are outlined above.  Orders Placed This Encounter  Procedures   EKG 12-Lead   No orders of the defined types were placed in this encounter.   Patient Instructions  Medication Instructions:  Continue  current medications *If you need a refill on your cardiac medications before your next appointment, please call your pharmacy*   Lab Work: none If you have labs (blood work) drawn today and your tests are completely normal, you will receive your results only by: MyChart Message (if you have MyChart) OR A paper copy in the mail If you have any lab test that is abnormal or we need to change your treatment, we will call you to review the results.   Testing/Procedures: none   Follow-Up: At [facility], you and your health needs are our priority.  As part of our continuing mission to provide you with exceptional heart care, we have created designated Provider Care Teams.  These Care Teams  include your primary Cardiologist (physician) and Advanced Practice Providers (APPs -  Physician Assistants and Nurse Practitioners) who all work together to provide you with the care you need, when you need it.  We recommend signing up for the patient portal called "MyChart".  Sign up information is provided on this After Visit Summary.  MyChart is used to connect with patients for Virtual Visits (Telemedicine).  Patients are able to view lab/test results, encounter notes, upcoming appointments, etc.  Non-urgent messages can be sent to your provider as well.   To learn more about what you can do with MyChart, go to [email or web address].    Your next appointment:   6 month(s)  Provider:   Little Ishikawa, MD     Other Instructions Your provider has recommended that you purchase a Omron upper arm blood pressure Please monitor blood pressure when you are lightheaded and send via my chart or call office with those readings        Signed, Little Ishikawa, MD  04/02/2023 12:21 PM    [facility]

## 2023-04-02 ENCOUNTER — Ambulatory Visit: Payer: Medicare Other | Attending: Cardiology | Admitting: Cardiology

## 2023-04-02 ENCOUNTER — Encounter: Payer: Self-pay | Admitting: Cardiology

## 2023-04-02 VITALS — BP 86/60 | HR 61 | Ht 72.0 in | Wt 191.0 lb

## 2023-04-02 DIAGNOSIS — I2510 Atherosclerotic heart disease of native coronary artery without angina pectoris: Secondary | ICD-10-CM | POA: Diagnosis not present

## 2023-04-02 DIAGNOSIS — E785 Hyperlipidemia, unspecified: Secondary | ICD-10-CM

## 2023-04-02 NOTE — Patient Instructions (Signed)
Medication Instructions:  Continue current medications *If you need a refill on your cardiac medications before your next appointment, please call your pharmacy*   Lab Work: none If you have labs (blood work) drawn today and your tests are completely normal, you will receive your results only by: MyChart Message (if you have MyChart) OR A paper copy in the mail If you have any lab test that is abnormal or we need to change your treatment, we will call you to review the results.   Testing/Procedures: none   Follow-Up: At [facility], you and your health needs are our priority.  As part of our continuing mission to provide you with exceptional heart care, we have created designated Provider Care Teams.  These Care Teams include your primary Cardiologist (physician) and Advanced Practice Providers (APPs -  Physician Assistants and Nurse Practitioners) who all work together to provide you with the care you need, when you need it.  We recommend signing up for the patient portal called "MyChart".  Sign up information is provided on this After Visit Summary.  MyChart is used to connect with patients for Virtual Visits (Telemedicine).  Patients are able to view lab/test results, encounter notes, upcoming appointments, etc.  Non-urgent messages can be sent to your provider as well.   To learn more about what you can do with MyChart, go to [email or web address].    Your next appointment:   6 month(s)  Provider:   Little Ishikawa, MD     Other Instructions Your provider has recommended that you purchase a Omron upper arm blood pressure Please monitor blood pressure when you are lightheaded and send via my chart or call office with those readings

## 2023-04-25 DIAGNOSIS — H2512 Age-related nuclear cataract, left eye: Secondary | ICD-10-CM | POA: Diagnosis not present

## 2023-06-03 DIAGNOSIS — I25119 Atherosclerotic heart disease of native coronary artery with unspecified angina pectoris: Secondary | ICD-10-CM | POA: Diagnosis not present

## 2023-06-03 DIAGNOSIS — E785 Hyperlipidemia, unspecified: Secondary | ICD-10-CM | POA: Diagnosis not present

## 2023-06-05 DIAGNOSIS — I25119 Atherosclerotic heart disease of native coronary artery with unspecified angina pectoris: Secondary | ICD-10-CM | POA: Diagnosis not present

## 2023-06-05 DIAGNOSIS — H409 Unspecified glaucoma: Secondary | ICD-10-CM | POA: Diagnosis not present

## 2023-06-05 DIAGNOSIS — E785 Hyperlipidemia, unspecified: Secondary | ICD-10-CM | POA: Diagnosis not present

## 2023-08-27 ENCOUNTER — Other Ambulatory Visit: Payer: Self-pay | Admitting: Cardiology

## 2023-08-27 DIAGNOSIS — R931 Abnormal findings on diagnostic imaging of heart and coronary circulation: Secondary | ICD-10-CM

## 2023-09-14 ENCOUNTER — Other Ambulatory Visit: Payer: Self-pay | Admitting: Cardiology

## 2023-09-19 DIAGNOSIS — Z961 Presence of intraocular lens: Secondary | ICD-10-CM | POA: Diagnosis not present

## 2023-09-19 DIAGNOSIS — H21233 Degeneration of iris (pigmentary), bilateral: Secondary | ICD-10-CM | POA: Diagnosis not present

## 2023-09-19 DIAGNOSIS — H40023 Open angle with borderline findings, high risk, bilateral: Secondary | ICD-10-CM | POA: Diagnosis not present

## 2023-10-09 DIAGNOSIS — K080 Exfoliation of teeth due to systemic causes: Secondary | ICD-10-CM | POA: Diagnosis not present

## 2023-11-05 ENCOUNTER — Other Ambulatory Visit: Payer: Self-pay | Admitting: Cardiology

## 2023-11-10 NOTE — Progress Notes (Unsigned)
 Cardiology Office Note:    Date:  11/11/2023   ID:  Thomas Curry, DOB 22-Nov-1955, MRN [id number]  PCP:  Leonel Cole, MD  Cardiologist:  Lonni LITTIE Nanas, MD  Electrophysiologist:  None   Referring MD: Leonel Cole, MD   No chief complaint on file.   History of Present Illness:    Thomas Curry is a 68 y.o. male with a hx of CAD, hyperlipidemia, glaucoma who presents for follow-up.  He was referred by Dr. Leonel for evaluation of chest pain, initially seen on 08/23/2022.   Coronary CTA on 09/04/2022 showed moderate mid LAD stenosis, not significant by FFR; calcium  score 12 (26 percentile).  Echocardiogram 09/14/2022 showed normal biventricular function, no significant valvular disease.  Since last clinic visit, he reports he is doing well.  Denies any chest pain, dyspnea, lower extremity edema, or palpitations.  Reports some lightheadedness with standing but denies any syncope.  Remains very active with his job, denies any exertional symptoms.   Past Medical History:  Diagnosis Date   Cataracts, bilateral    Glaucoma     Past Surgical History:  Procedure Laterality Date   APPENDECTOMY  1961    Current Medications: Current Meds  Medication Sig   ALPHAGAN P 0.1 % SOLN Place 1 drop into both eyes 2 (two) times daily.    aspirin EC 81 MG tablet Take 1 tablet (81 mg total) by mouth daily. Swallow whole.   ezetimibe  (ZETIA ) 10 MG tablet TAKE ONE TABLET (10 MG TOTAL) BY MOUTH DAILY.   isosorbide  mononitrate (IMDUR ) 30 MG 24 hr tablet TAKE ONE HALF (1/2) TABLET ( 15 MG ) DAILY   nitroGLYCERIN  (NITROSTAT ) 0.4 MG SL tablet Place 0.4 mg under the tongue as needed for chest pain.   rosuvastatin  (CRESTOR ) 20 MG tablet TAKE ONE TABLET (20 MG TOTAL) BY MOUTH DAILY.   timolol (TIMOPTIC) 0.5 % ophthalmic solution Place 1 drop into both eyes daily.   Current Facility-Administered Medications for the 11/11/23 encounter (Office Visit) with Nanas Lonni LITTIE, MD  Medication   0.9 %   sodium chloride  infusion     Allergies:   Patient has no known allergies.   Social History   Socioeconomic History   Marital status: Married    Spouse name: Not on file   Number of children: Not on file   Years of education: Not on file   Highest education level: Not on file  Occupational History   Not on file  Tobacco Use   Smoking status: Never   Smokeless tobacco: Former    Types: Chew  Substance and Sexual Activity   Alcohol use: No   Drug use: No   Sexual activity: Not on file  Other Topics Concern   Not on file  Social History Narrative   Married.   Works as a Academic librarian.   Highest level of education: high school.    Social Drivers of Corporate investment banker Strain: Not on file  Food Insecurity: Not on file  Transportation Needs: Not on file  Physical Activity: Not on file  Stress: Not on file  Social Connections: Not on file     Family History: The patient's family history includes Heart disease in his father. There is no history of Colon cancer, Esophageal cancer, Stomach cancer, or Pancreatic cancer.  ROS:   Please see the history of present illness.     All other systems reviewed and are negative.  EKGs/Labs/Other Studies Reviewed:    The following studies  were reviewed today:   EKG:   11/11/23: NSR, rate 70, no ST abnormality 08/23/2022 sinus bradycardia, rate 51, no ST abnormality 04/02/2023: Normal sinus rhythm, rate 61, no ST abnormalities   Recent Labs: No results found for requested labs within last 365 days.  Recent Lipid Panel    Component Value Date/Time   CHOL 124 02/12/2023 0855   TRIG 85 02/12/2023 0855   HDL 43 02/12/2023 0855   CHOLHDL 2.9 02/12/2023 0855   CHOLHDL 6 05/15/2016 1518   VLDL 33.6 05/15/2016 1518   LDLCALC 64 02/12/2023 0855    Physical Exam:    VS:  BP 115/72 (BP Location: Right Arm, Patient Position: Sitting)   Pulse 66   Ht 6' (1.829 m)   Wt 184 lb (83.5 kg)   BMI 24.95 kg/m     Wt Readings  from Last 3 Encounters:  11/11/23 184 lb (83.5 kg)  04/02/23 191 lb (86.6 kg)  11/15/22 191 lb 9.6 oz (86.9 kg)     GEN:  Well nourished, well developed in no acute distress HEENT: Normal NECK: No JVD; No carotid bruits CARDIAC: RRR, no murmurs, rubs, gallops RESPIRATORY:  Clear to auscultation without rales, wheezing or rhonchi  ABDOMEN: Soft, non-tender, non-distended MUSCULOSKELETAL:  No edema; No deformity  SKIN: Warm and dry NEUROLOGIC:  Alert and oriented x 3 PSYCHIATRIC:  Normal affect   ASSESSMENT:    1. Coronary artery disease involving native coronary artery of native heart without angina pectoris   2. Chest pain of uncertain etiology   3. Hyperlipidemia, unspecified hyperlipidemia type       PLAN:    CAD: Reported chest pain suggesting typical angina, as describes exertional chest pressure that resolves with rest.  Coronary CTA on 09/04/2022 showed moderate mid LAD stenosis, not significant by FFR; calcium  score 12 (26 percentile).  Echocardiogram 09/14/2022 showed normal biventricular function, no significant valvular disease. 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  Started on Imdur  15 mg daily and he reports resolution of his chest pain.  Could consider cardiac PET to confirm microvascular disease, but he wishes to hold off for now as his chest pain has resolved.  Will continue Imdur  -Continue aspirin, statin - Reports his brother was recently found to have obstructive CAD and found to have high LP(a), he requests to check his Lp(a), will order  Hyperlipidemia: On rosuvastatin  10 mg daily.  LDL 97 on 08/14/2022.  Given coronary CTA results as above, rosuvastatin  dose increased to 20 mg daily for goal LDL less than 70.  LDL 87 on 11/09/2022, add Zetia  10 mg daily.  LDL 65 on 06/03/23.  Check LP(a) as above  RTC in 6 months   Medication Adjustments/Labs and Tests Ordered: Current medicines are  reviewed at length with the patient today.  Concerns regarding medicines are outlined above.  Orders Placed This Encounter  Procedures   EKG 12-Lead   No orders of the defined types were placed in this encounter.   There are no Patient Instructions on file for this visit.   Signed, Lonni LITTIE Nanas, MD  11/11/2023 8:37 AM    [facility]

## 2023-11-11 ENCOUNTER — Ambulatory Visit: Attending: Cardiology | Admitting: Cardiology

## 2023-11-11 ENCOUNTER — Telehealth: Payer: Self-pay

## 2023-11-11 VITALS — BP 115/72 | HR 66 | Ht 72.0 in | Wt 184.0 lb

## 2023-11-11 DIAGNOSIS — I2510 Atherosclerotic heart disease of native coronary artery without angina pectoris: Secondary | ICD-10-CM | POA: Diagnosis not present

## 2023-11-11 DIAGNOSIS — R079 Chest pain, unspecified: Secondary | ICD-10-CM | POA: Diagnosis not present

## 2023-11-11 DIAGNOSIS — E785 Hyperlipidemia, unspecified: Secondary | ICD-10-CM

## 2023-11-11 NOTE — Telephone Encounter (Signed)
 Call received from [facility] states order for lipid panel input this morning was placed to be done at [facility] vs [facility]. Order placed at this time and released for them.

## 2023-11-11 NOTE — Patient Instructions (Addendum)
 Medication Instructions:  Contrinue current medication *If you need a refill on your cardiac medications before your next appointment, please call your pharmacy*  Lab Work: Lipid panel. lpa If you have labs (blood work) drawn today and your tests are completely normal, you will receive your results only by: MyChart Message (if you have MyChart) OR A paper copy in the mail If you have any lab test that is abnormal or we need to change your treatment, we will call you to review the results.  Testing/Procedures: none  Follow-Up: At [facility], you and your health needs are our priority.  As part of our continuing mission to provide you with exceptional heart care, our providers are all part of one team.  This team includes your primary Cardiologist (physician) and Advanced Practice Providers or APPs (Physician Assistants and Nurse Practitioners) who all work together to provide you with the care you need, when you need it.  Your next appointment:   6 month(s)  Provider:   Lonni LITTIE Nanas, MD    We recommend signing up for the patient portal called MyChart.  Sign up information is provided on this After Visit Summary.  MyChart is used to connect with patients for Virtual Visits (Telemedicine).  Patients are able to view lab/test results, encounter notes, upcoming appointments, etc.  Non-urgent messages can be sent to your provider as well.   To learn more about what you can do with MyChart, go to [email or web address].   Other Instructions none

## 2023-11-11 NOTE — Addendum Note (Signed)
 Addended by: KATE BRUCKNER on: 11/11/2023 08:38 AM   Modules accepted: Orders

## 2023-11-12 ENCOUNTER — Ambulatory Visit: Payer: Self-pay | Admitting: Cardiology

## 2023-11-12 LAB — LIPID PANEL
Chol/HDL Ratio: 3.2 ratio (ref 0.0–5.0)
Cholesterol, Total: 123 mg/dL (ref 100–199)
HDL: 38 mg/dL — ABNORMAL LOW (ref >39)
LDL Chol Calc (NIH): 58 mg/dL (ref 0–99)
Triglycerides: 159 mg/dL — ABNORMAL HIGH (ref 0–149)
VLDL Cholesterol Cal: 27 mg/dL (ref 5–40)

## 2023-11-12 LAB — LIPOPROTEIN A (LPA): Lipoprotein (a): 251.8 nmol/L — ABNORMAL HIGH (ref <75.0)

## 2023-12-02 DIAGNOSIS — E785 Hyperlipidemia, unspecified: Secondary | ICD-10-CM | POA: Diagnosis not present

## 2023-12-02 DIAGNOSIS — I25119 Atherosclerotic heart disease of native coronary artery with unspecified angina pectoris: Secondary | ICD-10-CM | POA: Diagnosis not present

## 2023-12-02 DIAGNOSIS — Z125 Encounter for screening for malignant neoplasm of prostate: Secondary | ICD-10-CM | POA: Diagnosis not present

## 2023-12-03 DIAGNOSIS — E785 Hyperlipidemia, unspecified: Secondary | ICD-10-CM | POA: Diagnosis not present

## 2023-12-03 DIAGNOSIS — I25119 Atherosclerotic heart disease of native coronary artery with unspecified angina pectoris: Secondary | ICD-10-CM | POA: Diagnosis not present

## 2023-12-03 DIAGNOSIS — Z0000 Encounter for general adult medical examination without abnormal findings: Secondary | ICD-10-CM | POA: Diagnosis not present

## 2024-02-01 DIAGNOSIS — Z23 Encounter for immunization: Secondary | ICD-10-CM | POA: Diagnosis not present

## 2024-02-05 ENCOUNTER — Other Ambulatory Visit: Payer: Self-pay | Admitting: Cardiology

## 2024-04-13 ENCOUNTER — Other Ambulatory Visit: Payer: Self-pay | Admitting: Cardiology

## 2024-04-13 DIAGNOSIS — R931 Abnormal findings on diagnostic imaging of heart and coronary circulation: Secondary | ICD-10-CM
